# Patient Record
Sex: Male | Born: 1995
Health system: Southern US, Community
[De-identification: ages and names within clinical notes are randomized; demographics above are authoritative.]

## PROBLEM LIST (undated history)

## (undated) DIAGNOSIS — I10 Essential (primary) hypertension: Secondary | ICD-10-CM

## (undated) HISTORY — PX: WISDOM TOOTH EXTRACTION: SHX21

---
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
## 2016-01-25 ENCOUNTER — Telehealth: Payer: Self-pay | Admitting: Family Medicine

## 2016-01-25 NOTE — Telephone Encounter (Signed)
Pt.'s mom called stating she is a pt of Dr. Elisabeth CaraGilbert's and wanted to know if Dr. Sullivan LoneGilbert will take on her son. Pt is coming out of [facility] per age. CB# [phone] Thanks CC

## 2016-01-25 NOTE — Telephone Encounter (Signed)
Please review when you get back, thank you-aa

## 2016-02-05 NOTE — Telephone Encounter (Signed)
yes

## 2016-02-07 NOTE — Telephone Encounter (Signed)
Please schedule

## 2016-02-07 NOTE — Telephone Encounter (Signed)
Appointment scheduled for 03/01/2016@9 :00/MW

## 2016-03-01 ENCOUNTER — Encounter: Payer: Self-pay | Admitting: Family Medicine

## 2016-03-01 ENCOUNTER — Ambulatory Visit (INDEPENDENT_AMBULATORY_CARE_PROVIDER_SITE_OTHER): Payer: BLUE CROSS/BLUE SHIELD | Admitting: Family Medicine

## 2016-03-01 VITALS — BP 110/70 | HR 58 | Temp 97.7°F | Resp 16 | Ht 70.0 in | Wt 158.0 lb

## 2016-03-01 DIAGNOSIS — Z0000 Encounter for general adult medical examination without abnormal findings: Secondary | ICD-10-CM

## 2016-03-01 DIAGNOSIS — Z7689 Persons encountering health services in other specified circumstances: Secondary | ICD-10-CM

## 2016-03-01 DIAGNOSIS — Z7189 Other specified counseling: Secondary | ICD-10-CM

## 2016-03-01 DIAGNOSIS — Z23 Encounter for immunization: Secondary | ICD-10-CM

## 2016-03-01 LAB — POCT URINALYSIS DIPSTICK
BILIRUBIN UA: NEGATIVE
Blood, UA: NEGATIVE
Glucose, UA: NEGATIVE
KETONES UA: NEGATIVE
LEUKOCYTES UA: NEGATIVE
Nitrite, UA: NEGATIVE
PROTEIN UA: NEGATIVE
Spec Grav, UA: 1.02
Urobilinogen, UA: 0.2
pH, UA: 6

## 2016-03-01 NOTE — Progress Notes (Signed)
Patient ID: Kyle Graves, male   DOB: Jul 20, 1996, 20 y.o.   MRN: [id number]    Subjective:  HPI Pt is here to establish care today. He has no know problems and has not issues that he needs to talk about. He needs a CPE.   He reports that he feeling well, no exercising but he is on his feet all day at work and sleeps well at night.  Immunization History  Administered Date(s) Administered  . DTaP 05/25/1996, 07/21/1996, 09/21/1996, 06/21/1997, 03/27/2000  . HPV Quadrivalent 12/07/2013, 02/19/2014, 06/15/2014  . Hepatitis A 03/27/2007, 01/11/2009  . Hepatitis B 12/05/1995, 05/25/1996, 12/30/1996  . HiB (PRP-OMP) 05/25/1996, 07/21/1996, 09/21/1996, 06/21/1997  . IPV 05/25/1996, 07/21/1996, 03/22/1997, 03/27/2000  . Influenza Nasal 06/15/2014  . Influenza-Unspecified 07/20/2009  . MMR 03/22/1997, 03/27/2000  . Meningococcal Conjugate 12/07/2013  . Tdap 03/27/2007  . Varicella 03/22/1997, 12/07/2013       Prior to Admission medications   Not on File    There are no active problems to display for this patient.   History reviewed. No pertinent past medical history.  Social History   Social History  . Marital Status: Single    Spouse Name: N/A  . Number of Children: N/A  . Years of Education: N/A   Occupational History  . Manager at [employer], Full time    Social History Main Topics  . Smoking status: Current Every Day Smoker -- 1.00 packs/day for 3 years  . Smokeless tobacco: Not on file  . Alcohol Use: Yes     Comment: everyday, beer and liquor average 8 beers a day  . Drug Use: No  . Sexual Activity: Not on file   Other Topics Concern  . Not on file   Social History Narrative    No Known Allergies  Review of Systems  Constitutional: Negative.   HENT: Negative.   Eyes: Negative.   Respiratory: Negative.   Cardiovascular: Negative.   Gastrointestinal: Negative.   Genitourinary: Negative.   Musculoskeletal: Negative.   Skin: Negative.     Neurological: Negative.   Endo/Heme/Allergies: Negative.   Psychiatric/Behavioral: Negative.     Immunization History  Administered Date(s) Administered  . DTaP 05/25/1996, 07/21/1996, 09/21/1996, 06/21/1997, 03/27/2000  . HPV Quadrivalent 12/07/2013, 02/19/2014, 06/15/2014  . Hepatitis A 03/27/2007, 01/11/2009  . Hepatitis B 05-01-1996, 05/25/1996, 12/30/1996  . HiB (PRP-OMP) 05/25/1996, 07/21/1996, 09/21/1996, 06/21/1997  . IPV 05/25/1996, 07/21/1996, 03/22/1997, 03/27/2000  . Influenza Nasal 06/15/2014  . Influenza-Unspecified 07/20/2009  . MMR 03/22/1997, 03/27/2000  . Meningococcal Conjugate 12/07/2013  . Tdap 03/27/2007  . Varicella 03/22/1997, 12/07/2013   Objective:  BP 110/70 mmHg  Pulse 58  Temp(Src) 97.7 F (36.5 C) (Oral)  Resp 16  Ht '5\' 10"'  (1.778 m)  Wt 158 lb (71.668 kg)  BMI 22.67 kg/m2  Physical Exam  Constitutional: He is oriented to person, place, and time and well-developed, well-nourished, and in no distress.  HENT:  Head: Normocephalic and atraumatic.  Right Ear: External ear normal.  Left Ear: External ear normal.  Nose: Nose normal.  Mouth/Throat: Oropharynx is clear and moist.  Eyes: Conjunctivae and EOM are normal. Pupils are equal, round, and reactive to light.  Neck: Normal range of motion. Neck supple.  Cardiovascular: Normal rate, regular rhythm, normal heart sounds and intact distal pulses.   Pulmonary/Chest: Effort normal and breath sounds normal.  Abdominal: Soft. Bowel sounds are normal.  Genitourinary: Penis normal.  Musculoskeletal: Normal range of motion.  Neurological: He is alert and oriented to person,  place, and time. He has normal reflexes. Gait normal. GCS score is 15.  Skin: Skin is warm and dry.  Psychiatric: Mood, memory, affect and judgment normal.    No results found for: WBC, HGB, HCT, PLT, GLUCOSE, CHOL, TRIG, HDL, LDLDIRECT, LDLCALC, TSH, PSA, INR, GLUF, HGBA1C, MICROALBUR  CMP  No results found for: NA, K, CL,  CO2, GLUCOSE, BUN, CREATININE, CALCIUM, PROT, ALBUMIN, AST, ALT, ALKPHOS, BILITOT, GFRNONAA, GFRAA  Assessment and Plan :  1. Encounter to establish care   2. Annual physical exam  - CBC with Differential/Platelet - Lipid Panel With LDL/HDL Ratio - TSH - Comprehensive metabolic panel - POCT urinalysis dipstick  3. Need for meningococcal vaccination  - Meningococcal B, OMV (Bexsero)  4. Contraception Discussed habits at length. We discussed the need for contraception that he wishes his girlfriend not to get pregnant. We also discussed possible STDs. He is not using drugs but is drinking anywhere from 2-12 beers per night. We discussed discontinuing this altogether. We'll bring this up again at his next visit. He seemed to be agreeable to this recommendation and I'm worried about follow through on this.   Miguel Aschoff MD [facility] 03/01/2016 9:29 AM

## 2016-04-05 ENCOUNTER — Ambulatory Visit: Payer: BLUE CROSS/BLUE SHIELD | Admitting: Family Medicine

## 2016-07-23 ENCOUNTER — Ambulatory Visit: Payer: BLUE CROSS/BLUE SHIELD | Admitting: Family Medicine

## 2016-08-07 ENCOUNTER — Ambulatory Visit (INDEPENDENT_AMBULATORY_CARE_PROVIDER_SITE_OTHER): Payer: BLUE CROSS/BLUE SHIELD | Admitting: Family Medicine

## 2016-08-07 ENCOUNTER — Encounter: Payer: Self-pay | Admitting: Family Medicine

## 2016-08-07 VITALS — BP 110/70 | HR 68 | Temp 98.0°F | Resp 14 | Wt 159.2 lb

## 2016-08-07 DIAGNOSIS — J039 Acute tonsillitis, unspecified: Secondary | ICD-10-CM | POA: Diagnosis not present

## 2016-08-07 DIAGNOSIS — R05 Cough: Secondary | ICD-10-CM

## 2016-08-07 DIAGNOSIS — R059 Cough, unspecified: Secondary | ICD-10-CM

## 2016-08-07 DIAGNOSIS — R509 Fever, unspecified: Secondary | ICD-10-CM

## 2016-08-07 LAB — POCT RAPID STREP A (OFFICE): RAPID STREP A SCREEN: NEGATIVE

## 2016-08-07 MED ORDER — AMOXICILLIN 875 MG PO TABS: 875.0000 mg | ORAL_TABLET | Freq: Two times a day (BID) | ORAL | 0 refills | Status: DC

## 2016-08-07 MED ORDER — HYDROCODONE-HOMATROPINE 5-1.5 MG/5ML PO SYRP: 5.0000 mL | ORAL_SOLUTION | Freq: Three times a day (TID) | ORAL | 0 refills | Status: DC | PRN

## 2016-08-07 NOTE — Progress Notes (Signed)
Patient: Kyle Graves Male    DOB: 06/19/1996   20 y.o.   MRN: [id number] Visit Date: 08/07/2016  Today's Provider: Dortha Kernennis Chrismon, PA   Chief Complaint  Patient presents with  . URI   Subjective:    URI   This is a new problem. The current episode started in the past 7 days. The problem has been unchanged. The maximum temperature recorded prior to his arrival was 102 - 102.9 F. The fever has been present for 1 to 2 days. Associated symptoms include congestion, joint pain and a sore throat. Associated symptoms comments: fever. Treatments tried: Benzonate 200 mg and Ipratropium Bromide prescribed at Urgent Care on 08/05/2016. The treatment provided no relief.   No past medical history on file. No past surgical history on file. Family History  Problem Relation Age of Onset  . Diabetes Paternal Uncle   . Heart disease Maternal Grandfather    No Known Allergies  Previous Medications   BENZONATATE (TESSALON) 200 MG CAPSULE    Take 200 mg by mouth 3 (three) times daily as needed for cough.   IPRATROPIUM (ATROVENT) 0.03 % NASAL SPRAY    Place 2 sprays into both nostrils every 12 (twelve) hours.    Review of Systems  Constitutional: Positive for fever.  HENT: Positive for congestion and sore throat.   Respiratory: Negative.   Cardiovascular: Negative.   Musculoskeletal: Positive for joint pain.    Social History  Substance Use Topics  . Smoking status: Current Every Day Smoker    Packs/day: 1.00    Years: 3.00  . Smokeless tobacco: Not on file  . Alcohol use Yes     Comment: everyday, beer and liquor average 8 beers a day   Objective:   BP 110/70 (BP Location: Right Arm, Patient Position: Sitting, Cuff Size: Normal)   Pulse 68   Temp 98 F (36.7 C) (Oral)   Resp 14   Wt 159 lb 3.2 oz (72.2 kg)   BMI 22.84 kg/m   Physical Exam  Constitutional: He is oriented to person, place, and time. He appears well-developed and well-nourished.  HENT:  Head:  Normocephalic.  Right Ear: External ear normal.  Left Ear: External ear normal.  Fiery red tonsillar pillars with white to yellow exudates. Nasal turbinates reddened.  Eyes: Conjunctivae and EOM are normal.  Neck: Neck supple.  Slightly prominent and mildly sore submandibular nodes.  Cardiovascular: Regular rhythm.   Pulmonary/Chest: Effort normal and breath sounds normal.  Abdominal: Bowel sounds are normal.  Lymphadenopathy:    He has cervical adenopathy.  Neurological: He is alert and oriented to person, place, and time.      Assessment & Plan:     1. Exudative tonsillitis Onset 2 days ago with fever and cough. Rapid strep test negative and flu test at the Urgent Care was negative on 08-05-16. Tonsils and throat red with white to yellow exudates. Will get Monospot, CBC and send throat culture to the lab. Given Amoxicillin and cough syrup. Gargle with warm saltwater and continue Tylenol or Advil prn fever. Home to rest. Increase fluid intake and recheck pending reports. - POCT rapid strep A - CBC with Differential/Platelet - Culture, Group A Strep - Monospot - amoxicillin (AMOXIL) 875 MG tablet; Take 1 tablet (875 mg total) by mouth 2 (two) times daily.  Dispense: 20 tablet; Refill: 0  2. Fever, unspecified fever cause Onset 2 days ago suddenly. Some cough but the worst symptom is the sore throat. Rapid strep  test negative. Will check culture at the lab and get CBC with Monospot test. May use Tylenol or Advil prn discomfort or fever. Home to rest. No work until free of fever for 24 hours. - POCT rapid strep A - CBC with Differential/Platelet - Monospot - amoxicillin (AMOXIL) 875 MG tablet; Take 1 tablet (875 mg total) by mouth 2 (two) times daily.  Dispense: 20 tablet; Refill: 0  3. Cough Hacking cough with occasional yellow sputum. Will give Hycodan cough syrup. No relief from OTC cough meds or Tessalon. Increase fluid intake and check CBC.  - CBC with Differential/Platelet -  amoxicillin (AMOXIL) 875 MG tablet; Take 1 tablet (875 mg total) by mouth 2 (two) times daily.  Dispense: 20 tablet; Refill: 0 - HYDROcodone-homatropine (HYCODAN) 5-1.5 MG/5ML syrup; Take 5 mLs by mouth every 8 (eight) hours as needed for cough.  Dispense: 120 mL; Refill: 0

## 2016-08-07 NOTE — Patient Instructions (Signed)
Tonsillitis Tonsillitis is an infection of the throat that causes the tonsils to become red, tender, and swollen. Tonsils are collections of lymphoid tissue at the back of the throat. Each tonsil has crevices (crypts). Tonsils help fight nose and throat infections and keep infection from spreading to other parts of the body for the first 18 months of life. What are the causes? Sudden (acute) tonsillitis is usually caused by infection with streptococcal bacteria. Long-lasting (chronic) tonsillitis occurs when the crypts of the tonsils become filled with pieces of food and bacteria, which makes it easy for the tonsils to become repeatedly infected. What are the signs or symptoms? Symptoms of tonsillitis include:  A sore throat, with possible difficulty swallowing.  White patches on the tonsils.  Fever.  Tiredness.  New episodes of snoring during sleep, when you did not snore before.  Small, foul-smelling, yellowish-white pieces of material (tonsilloliths) that you occasionally cough up or spit out. The tonsilloliths can also cause you to have bad breath.  How is this diagnosed? Tonsillitis can be diagnosed through a physical exam. Diagnosis can be confirmed with the results of lab tests, including a throat culture. How is this treated? The goals of tonsillitis treatment include the reduction of the severity and duration of symptoms and prevention of associated conditions. Symptoms of tonsillitis can be improved with the use of steroids to reduce the swelling. Tonsillitis caused by bacteria can be treated with antibiotic medicines. Usually, treatment with antibiotic medicines is started before the cause of the tonsillitis is known. However, if it is determined that the cause is not bacterial, antibiotic medicines will not treat the tonsillitis. If attacks of tonsillitis are severe and frequent, your health care provider may recommend surgery to remove the tonsils (tonsillectomy). Follow these  instructions at home:  Rest as much as possible and get plenty of sleep.  Drink plenty of fluids. While the throat is very sore, eat soft foods or liquids, such as sherbet, soups, or instant breakfast drinks.  Eat frozen ice pops.  Gargle with a warm or cold liquid to help soothe the throat. Mix 1/4 teaspoon of salt and 1/4 teaspoon of baking soda in 8 oz of water. Contact a health care provider if:  Large, tender lumps develop in your neck.  A rash develops.  A green, yellow-brown, or bloody substance is coughed up.  You are unable to swallow liquids or food for 24 hours.  You notice that only one of the tonsils is swollen. Get help right away if:  You develop any new symptoms such as vomiting, severe headache, stiff neck, chest pain, or trouble breathing or swallowing.  You have severe throat pain along with drooling or voice changes.  You have severe pain, unrelieved with recommended medications.  You are unable to fully open the mouth.  You develop redness, swelling, or severe pain anywhere in the neck.  You have a fever. This information is not intended to replace advice given to you by your health care provider. Make sure you discuss any questions you have with your health care provider. Document Released: 06/06/2005 Document Revised: 02/02/2016 Document Reviewed: 02/13/2013 Elsevier Interactive Patient Education  2017 Elsevier Inc.  

## 2016-08-08 LAB — CBC WITH DIFFERENTIAL/PLATELET
BASOS ABS: 0 10*3/uL (ref 0.0–0.2)
Basos: 0 %
EOS (ABSOLUTE): 0.1 10*3/uL (ref 0.0–0.4)
Eos: 2 %
Hematocrit: 43.9 % (ref 37.5–51.0)
Hemoglobin: 15.2 g/dL (ref 12.6–17.7)
Immature Grans (Abs): 0 10*3/uL (ref 0.0–0.1)
Immature Granulocytes: 0 %
LYMPHS ABS: 1.3 10*3/uL (ref 0.7–3.1)
Lymphs: 15 %
MCH: 30.8 pg (ref 26.6–33.0)
MCHC: 34.6 g/dL (ref 31.5–35.7)
MCV: 89 fL (ref 79–97)
MONOS ABS: 1.1 10*3/uL — AB (ref 0.1–0.9)
Monocytes: 13 %
Neutrophils Absolute: 6.3 10*3/uL (ref 1.4–7.0)
Neutrophils: 70 %
Platelets: 205 10*3/uL (ref 150–379)
RBC: 4.93 x10E6/uL (ref 4.14–5.80)
RDW: 13.4 % (ref 12.3–15.4)
WBC: 8.9 10*3/uL (ref 3.4–10.8)

## 2016-08-08 LAB — MONONUCLEOSIS SCREEN: MONO SCREEN: NEGATIVE

## 2016-08-09 ENCOUNTER — Telehealth: Payer: Self-pay

## 2016-08-09 NOTE — Telephone Encounter (Signed)
-----   Message from Tamsen Roersennis E Chrismon, [place] sent at 08/09/2016 12:36 AM EST ----- Negative mono test and normal blood cell tests. Take all the antibiotic given and gargle with warm saltwater 4-5 times a day. Recheck if no better in 10 days.

## 2016-08-09 NOTE — Telephone Encounter (Signed)
Patient's mother Lilia ProDebra Long ([place]) advised as directed below.

## 2016-08-11 LAB — CULTURE, GROUP A STREP

## 2016-08-13 ENCOUNTER — Telehealth: Payer: Self-pay

## 2016-08-13 NOTE — Telephone Encounter (Signed)
Pt advised.   Thanks,   -Shanequia Kendrick  

## 2016-08-13 NOTE — Telephone Encounter (Signed)
-----   Message from Tamsen Roersennis E Chrismon, [place] sent at 08/13/2016  9:08 AM EST ----- Some bacteria on lab throat culture. Finish all the antibiotics and recheck if any symptoms remain. Mono test was negative.

## 2016-08-13 NOTE — Telephone Encounter (Signed)
LMTCB

## 2016-08-13 NOTE — Telephone Encounter (Signed)
Pt's mom called back.  teri

## 2017-01-07 ENCOUNTER — Ambulatory Visit (INDEPENDENT_AMBULATORY_CARE_PROVIDER_SITE_OTHER): Payer: BLUE CROSS/BLUE SHIELD | Admitting: Family Medicine

## 2017-01-07 ENCOUNTER — Encounter: Payer: Self-pay | Admitting: Family Medicine

## 2017-01-07 VITALS — BP 110/82 | HR 69 | Temp 97.8°F | Wt 151.8 lb

## 2017-01-07 DIAGNOSIS — Z113 Encounter for screening for infections with a predominantly sexual mode of transmission: Secondary | ICD-10-CM

## 2017-01-07 NOTE — Progress Notes (Signed)
   Patient: Kyle Graves Male    DOB: [birth year]   21 y.o.   MRN: [id number] Visit Date: 01/07/2017  Today's Provider: Dortha Kern, PA   Chief Complaint  Patient presents with  . Follow-up    STD check   Subjective:    HPI Patient is here today requesting a STD check. The patient's pertinent negatives include no genital injury, genital itching, genital lesions, pelvic pain, penile discharge, penile pain, priapism, scrotal swelling or testicular pain. Patient has unknown exposure to STD's.   There are no active problems to display for this patient.  No past medical history on file.   No past surgical history on file.   Family History  Problem Relation Age of Onset  . Diabetes Paternal Uncle   . Heart disease Maternal Grandfather    No Known Allergies    Previous Medications   No medications on file    Review of Systems  Social History  Substance Use Topics  . Smoking status: Current Every Day Smoker    Packs/day: 1.00    Years: 3.00  . Smokeless tobacco: Never Used  . Alcohol use Yes     Comment: everyday, beer and liquor average 8 beers a day   Objective:   BP 110/82 (BP Location: Right Arm, Patient Position: Sitting, Cuff Size: Normal)   Pulse 69   Temp 97.8 F (36.6 C) (Oral)   Wt 151 lb 12.8 oz (68.9 kg)   SpO2 98%   BMI 21.78 kg/m   Physical Exam  Cardiovascular: Normal rate and regular rhythm.   Pulmonary/Chest: Effort normal and breath sounds normal.  Abdominal: Soft. Bowel sounds are normal.  Genitourinary: Penis normal. No penile tenderness.      Assessment & Plan:     1. Screening examination for STD (sexually transmitted disease) Has had a couple of male sexual partners in the past 6 months. Last intercourse was a week ago. Partners have not had any diagnoses or symptoms. He denies fever, rash, penile discharge, genital lesions or local lymphadenopathy. "Just want to be sure." Given printout regarding STD precautions. Recheck  pending lab reports. - HIV antibody - GC/Chlamydia Probe Amp

## 2017-01-07 NOTE — Patient Instructions (Signed)
Sexually Transmitted Disease A sexually transmitted disease (STD) is a disease or infection that may be passed (transmitted) from person to person, usually during sexual activity. This may happen by way of saliva, semen, blood, vaginal mucus, or urine. Common STDs include:  Gonorrhea.  Chlamydia.  Syphilis.  HIV and AIDS.  Genital herpes.  Hepatitis B and C.  Trichomonas.  Human papillomavirus (HPV).  Pubic lice.  Scabies.  Mites.  Bacterial vaginosis. What are the causes? An STD may be caused by bacteria, a virus, or parasites. STDs are often transmitted during sexual activity if one person is infected. However, they may also be transmitted through nonsexual means. STDs may be transmitted after:  Sexual intercourse with an infected person.  Sharing sex toys with an infected person.  Sharing needles with an infected person or using unclean piercing or tattoo needles.  Having intimate contact with the genitals, mouth, or rectal areas of an infected person.  Exposure to infected fluids during birth. What are the signs or symptoms? Different STDs have different symptoms. Some people may not have any symptoms. If symptoms are present, they may include:  Painful or bloody urination.  Pain in the pelvis, abdomen, vagina, anus, throat, or eyes.  A skin rash, itching, or irritation.  Growths, ulcerations, blisters, or sores in the genital and anal areas.  Abnormal vaginal discharge with or without bad odor.  Penile discharge in men.  Fever.  Pain or bleeding during sexual intercourse.  Swollen glands in the groin area.  Yellow skin and eyes (jaundice). This is seen with hepatitis.  Swollen testicles.  Infertility.  Sores and blisters in the mouth. How is this diagnosed? To make a diagnosis, your health care provider may:  Take a medical history.  Perform a physical exam.  Take a sample of any discharge to examine.  Swab the throat, cervix, opening to  the penis, rectum, or vagina for testing.  Test a sample of your first morning urine.  Perform blood tests.  Perform a Pap test, if this applies.  Perform a colposcopy.  Perform a laparoscopy. How is this treated? Treatment depends on the STD. Some STDs may be treated but not cured.  Chlamydia, gonorrhea, trichomonas, and syphilis can be cured with antibiotic medicine.  Genital herpes, hepatitis, and HIV can be treated, but not cured, with prescribed medicines. The medicines lessen symptoms.  Genital warts from HPV can be treated with medicine or by freezing, burning (electrocautery), or surgery. Warts may come back.  HPV cannot be cured with medicine or surgery. However, abnormal areas may be removed from the cervix, vagina, or vulva.  If your diagnosis is confirmed, your recent sexual partners need treatment. This is true even if they are symptom-free or have a negative culture or evaluation. They should not have sex until their health care providers say it is okay.  Your health care provider may test you for infection again 3 months after treatment. How is this prevented? Take these steps to reduce your risk of getting an STD:  Use latex condoms, dental dams, and water-soluble lubricants during sexual activity. Do not use petroleum jelly or oils.  Avoid having multiple sex partners.  Do not have sex with someone who has other sex partners.  Do not have sex with anyone you do not know or who is at high risk for an STD.  Avoid risky sex practices that can break your skin.  Do not have sex if you have open sores on your mouth or skin.    Avoid drinking too much alcohol or taking illegal drugs. Alcohol and drugs can affect your judgment and put you in a vulnerable position.  Avoid engaging in oral and anal sex acts.  Get vaccinated for HPV and hepatitis. If you have not received these vaccines in the past, talk to your health care provider about whether one or both might be  right for you.  If you are at risk of being infected with HIV, it is recommended that you take a prescription medicine daily to prevent HIV infection. This is called pre-exposure prophylaxis (PrEP). You are considered at risk if:  You are a man who has sex with other men (MSM).  You are a heterosexual man or woman and are sexually active with more than one partner.  You take drugs by injection.  You are sexually active with a partner who has HIV.  Talk with your health care provider about whether you are at high risk of being infected with HIV. If you choose to begin PrEP, you should first be tested for HIV. You should then be tested every 3 months for as long as you are taking PrEP. Contact a health care provider if:  See your health care provider.  Tell your sexual partner(s). They should be tested and treated for any STDs.  Do not have sex until your health care provider says it is okay. Get help right away if: Contact your health care provider right away if:  You have severe abdominal pain.  You are a man and notice swelling or pain in your testicles.  You are a woman and notice swelling or pain in your vagina. This information is not intended to replace advice given to you by your health care provider. Make sure you discuss any questions you have with your health care provider. Document Released: 11/17/2002 Document Revised: 03/16/2016 Document Reviewed: 03/17/2013 Elsevier Interactive Patient Education  2017 [employer]  

## 2017-01-08 LAB — HIV ANTIBODY (ROUTINE TESTING W REFLEX): HIV SCREEN 4TH GENERATION: NONREACTIVE

## 2017-01-08 LAB — GC/CHLAMYDIA PROBE AMP
CHLAMYDIA, DNA PROBE: NEGATIVE
NEISSERIA GONORRHOEAE BY PCR: NEGATIVE

## 2017-01-09 ENCOUNTER — Telehealth: Payer: Self-pay

## 2017-01-09 NOTE — Telephone Encounter (Signed)
Pt returned Nikki's call  His call back is [phone]  [facility]

## 2017-01-09 NOTE — Telephone Encounter (Signed)
-----   Message from Tamsen Roers, [place] sent at 01/08/2017  7:37 AM EDT ----- HIV blood test negative. Awaiting final lab reports of urine tests.

## 2017-01-09 NOTE — Telephone Encounter (Signed)
-----   Message from Tamsen Roers, [place] sent at 01/08/2017  4:56 PM EDT ----- All tests negative for any STD. Recheck prn.

## 2017-01-09 NOTE — Telephone Encounter (Signed)
LMTCB

## 2017-01-09 NOTE — Telephone Encounter (Signed)
Patient advised.

## 2017-04-26 ENCOUNTER — Encounter: Payer: Self-pay | Admitting: Family Medicine

## 2017-04-26 ENCOUNTER — Ambulatory Visit (INDEPENDENT_AMBULATORY_CARE_PROVIDER_SITE_OTHER): Payer: BLUE CROSS/BLUE SHIELD | Admitting: Family Medicine

## 2017-04-26 VITALS — BP 122/76 | HR 86 | Temp 98.0°F | Resp 16 | Wt 151.0 lb

## 2017-04-26 DIAGNOSIS — J069 Acute upper respiratory infection, unspecified: Secondary | ICD-10-CM | POA: Diagnosis not present

## 2017-04-26 NOTE — Patient Instructions (Signed)
Try tylenol/ibuprofen for aches or fevers, mucinex for cough and chest congestion, and nasal saline spray for congestion   Upper Respiratory Infection, Adult Most upper respiratory infections (URIs) are a viral infection of the air passages leading to the lungs. A URI affects the nose, throat, and upper air passages. The most common type of URI is nasopharyngitis and is typically referred to as "the common cold." URIs run their course and usually go away on their own. Most of the time, a URI does not require medical attention, but sometimes a bacterial infection in the upper airways can follow a viral infection. This is called a secondary infection. Sinus and middle ear infections are common types of secondary upper respiratory infections. Bacterial pneumonia can also complicate a URI. A URI can worsen asthma and chronic obstructive pulmonary disease (COPD). Sometimes, these complications can require emergency medical care and may be life threatening. What are the causes? Almost all URIs are caused by viruses. A virus is a type of germ and can spread from one person to another. What increases the risk? You may be at risk for a URI if:  You smoke.  You have chronic heart or lung disease.  You have a weakened defense (immune) system.  You are very young or very old.  You have nasal allergies or asthma.  You work in crowded or poorly ventilated areas.  You work in health care facilities or schools.  What are the signs or symptoms? Symptoms typically develop 2-3 days after you come in contact with a cold virus. Most viral URIs last 7-10 days. However, viral URIs from the influenza virus (flu virus) can last 14-18 days and are typically more severe. Symptoms may include:  Runny or stuffy (congested) nose.  Sneezing.  Cough.  Sore throat.  Headache.  Fatigue.  Fever.  Loss of appetite.  Pain in your forehead, behind your eyes, and over your cheekbones (sinus pain).  Muscle  aches.  How is this diagnosed? Your health care provider may diagnose a URI by:  Physical exam.  Tests to check that your symptoms are not due to another condition such as: ? Strep throat. ? Sinusitis. ? Pneumonia. ? Asthma.  How is this treated? A URI goes away on its own with time. It cannot be cured with medicines, but medicines may be prescribed or recommended to relieve symptoms. Medicines may help:  Reduce your fever.  Reduce your cough.  Relieve nasal congestion.  Follow these instructions at home:  Take medicines only as directed by your health care provider.  Gargle warm saltwater or take cough drops to comfort your throat as directed by your health care provider.  Use a warm mist humidifier or inhale steam from a shower to increase air moisture. This may make it easier to breathe.  Drink enough fluid to keep your urine clear or pale yellow.  Eat soups and other clear broths and maintain good nutrition.  Rest as needed.  Return to work when your temperature has returned to normal or as your health care provider advises. You may need to stay home longer to avoid infecting others. You can also use a face mask and careful hand washing to prevent spread of the virus.  Increase the usage of your inhaler if you have asthma.  Do not use any tobacco products, including cigarettes, chewing tobacco, or electronic cigarettes. If you need help quitting, ask your health care provider. How is this prevented? The best way to protect yourself from getting a  cold is to practice good hygiene.  Avoid oral or hand contact with people with cold symptoms.  Wash your hands often if contact occurs.  There is no clear evidence that vitamin C, vitamin E, echinacea, or exercise reduces the chance of developing a cold. However, it is always recommended to get plenty of rest, exercise, and practice good nutrition. Contact a health care provider if:  You are getting worse rather than  better.  Your symptoms are not controlled by medicine.  You have chills.  You have worsening shortness of breath.  You have brown or red mucus.  You have yellow or brown nasal discharge.  You have pain in your face, especially when you bend forward.  You have a fever.  You have swollen neck glands.  You have pain while swallowing.  You have white areas in the back of your throat. Get help right away if:  You have severe or persistent: ? Headache. ? Ear pain. ? Sinus pain. ? Chest pain.  You have chronic lung disease and any of the following: ? Wheezing. ? Prolonged cough. ? Coughing up blood. ? A change in your usual mucus.  You have a stiff neck.  You have changes in your: ? Vision. ? Hearing. ? Thinking. ? Mood. This information is not intended to replace advice given to you by your health care provider. Make sure you discuss any questions you have with your health care provider. Document Released: 02/20/2001 Document Revised: 04/29/2016 Document Reviewed: 12/02/2013 Elsevier Interactive Patient Education  2017 [employer].

## 2017-04-26 NOTE — Progress Notes (Signed)
Patient: Kyle Graves Male    DOB: 06-Apr-1996   21 y.o.   MRN: [id number] Visit Date: 04/26/2017  Today's Provider: Shirlee Latch, MD   Chief Complaint  Patient presents with  . URI   Subjective:    URI   This is a new problem. The current episode started yesterday. The problem has been gradually worsening. Maximum temperature: undocumented temperature, but pt had chills and sweats yesterday. Associated symptoms include abdominal pain (only because he needs to use the bathroom currently), chest pain, congestion, coughing (productive of yellow sputum), nausea, rhinorrhea and sneezing. Pertinent negatives include no diarrhea, dysuria, ear pain, headaches, joint pain, neck pain, plugged ear sensation, rash, sinus pain, sore throat (but states it is scratchy, +post-nasal drip), swollen glands, vomiting or wheezing. He has tried antihistamine (OTC Sinus/Cold Medication) for the symptoms. The treatment provided no relief.   No known sick contacts    No Known Allergies  No current outpatient prescriptions on file.  Review of Systems  HENT: Positive for congestion, rhinorrhea and sneezing. Negative for ear pain, sinus pain and sore throat (but states it is scratchy, +post-nasal drip).   Respiratory: Positive for cough (productive of yellow sputum). Negative for wheezing.   Cardiovascular: Positive for chest pain.  Gastrointestinal: Positive for abdominal pain (only because he needs to use the bathroom currently) and nausea. Negative for diarrhea and vomiting.  Genitourinary: Negative for dysuria.  Musculoskeletal: Negative for joint pain and neck pain.  Skin: Negative for rash.  Neurological: Negative for headaches.    Social History  Substance Use Topics  . Smoking status: Current Every Day Smoker    Packs/day: 0.50    Years: 3.00  . Smokeless tobacco: Never Used  . Alcohol use Yes     Comment: everyday, beer and liquor average 8 beers a day   Objective:   BP  122/76 (BP Location: Left Arm, Patient Position: Sitting, Cuff Size: Normal)   Pulse 86   Temp 98 F (36.7 C) (Oral)   Resp 16   Wt 151 lb (68.5 kg)   SpO2 96%   BMI 21.67 kg/m  Vitals:   04/26/17 1050  BP: 122/76  Pulse: 86  Resp: 16  Temp: 98 F (36.7 C)  TempSrc: Oral  SpO2: 96%  Weight: 151 lb (68.5 kg)     Physical Exam  Constitutional: He is oriented to person, place, and time. He appears well-developed and well-nourished. No distress.  HENT:  Head: Normocephalic and atraumatic.  Right Ear: Tympanic membrane and external ear normal.  Left Ear: Tympanic membrane and external ear normal.  Mouth/Throat: Oropharynx is clear and moist. No oropharyngeal exudate.  Eyes: Pupils are equal, round, and reactive to light. Conjunctivae are normal. No scleral icterus.  Neck: Neck supple.  Cardiovascular: Normal rate, regular rhythm, normal heart sounds and intact distal pulses.   No murmur heard. Pulmonary/Chest: Effort normal and breath sounds normal. No respiratory distress. He has no wheezes. He has no rales.  Abdominal: Soft. Bowel sounds are normal. He exhibits no distension. There is no tenderness. There is no rebound and no guarding.  Musculoskeletal: He exhibits no edema.  Lymphadenopathy:    He has no cervical adenopathy.  Neurological: He is alert and oriented to person, place, and time.  Skin: Skin is warm and dry. No rash noted.  Vitals reviewed.       Assessment & Plan:         1. Viral URI - no evidence of  bacterial infection, exam benign - discussed expected course, symptomatic management, and return precautions  The entirety of the information documented in the History of Present Illness, Review of Systems and Physical Exam were personally obtained by me. Portions of this information were initially documented by Irving Burton Ratchford, CMA and reviewed by me for thoroughness and accuracy.    Shirlee Latch, MD  [facility]

## 2017-06-04 ENCOUNTER — Ambulatory Visit (INDEPENDENT_AMBULATORY_CARE_PROVIDER_SITE_OTHER): Payer: BLUE CROSS/BLUE SHIELD | Admitting: Family Medicine

## 2017-06-04 ENCOUNTER — Encounter: Payer: Self-pay | Admitting: Family Medicine

## 2017-06-04 VITALS — BP 116/82 | HR 72 | Temp 98.1°F | Resp 16 | Wt 152.0 lb

## 2017-06-04 DIAGNOSIS — J029 Acute pharyngitis, unspecified: Secondary | ICD-10-CM | POA: Diagnosis not present

## 2017-06-04 LAB — POCT RAPID STREP A (OFFICE): Rapid Strep A Screen: NEGATIVE

## 2017-06-04 NOTE — Progress Notes (Signed)
Patient: Kyle Graves Male    DOB: March 09, 1996   21 y.o.   MRN: [id number] Visit Date: 06/04/2017  Today's Provider: Shirlee Latch, MD   Chief Complaint  Patient presents with  . Sore Throat   Subjective:    Sore Throat   This is a recurrent problem. Episode onset: x 2 days. The problem has been unchanged. The pain is worse on the left side. The maximum temperature recorded prior to his arrival was 101 - 101.9 F (yesterday). The pain is at a severity of 4/10. The pain is moderate. Associated symptoms include coughing (dry), headaches, a plugged ear sensation, swollen glands and trouble swallowing. Pertinent negatives include no abdominal pain, congestion, diarrhea, ear discharge, ear pain, hoarse voice, shortness of breath or vomiting. He has tried NSAIDs (antihistamines) for the symptoms. The treatment provided moderate relief.       No Known Allergies  No current outpatient prescriptions on file.  Review of Systems  HENT: Positive for trouble swallowing. Negative for congestion, ear discharge, ear pain and hoarse voice.   Respiratory: Positive for cough (dry). Negative for shortness of breath.   Gastrointestinal: Negative for abdominal pain, diarrhea and vomiting.  Neurological: Positive for headaches.    Social History  Substance Use Topics  . Smoking status: Current Every Day Smoker    Packs/day: 0.50    Years: 3.00  . Smokeless tobacco: Never Used  . Alcohol use Yes     Comment: everyday, beer and liquor average 8 beers a day   Objective:   BP 116/82 (BP Location: Left Arm, Patient Position: Sitting, Cuff Size: Normal)   Pulse 72   Temp 98.1 F (36.7 C) (Oral)   Resp 16   Wt 152 lb (68.9 kg)   SpO2 98%   BMI 21.81 kg/m  Vitals:   06/04/17 0806  BP: 116/82  Pulse: 72  Resp: 16  Temp: 98.1 F (36.7 C)  TempSrc: Oral  SpO2: 98%  Weight: 152 lb (68.9 kg)     Physical Exam  Constitutional: He is oriented to person, place, and time.  He appears well-developed and well-nourished. No distress.  HENT:  Head: Normocephalic and atraumatic.  Right Ear: Tympanic membrane, external ear and ear canal normal.  Left Ear: Tympanic membrane, external ear and ear canal normal.  Nose: Nose normal.  Mouth/Throat: Uvula is midline and mucous membranes are normal. Posterior oropharyngeal erythema present. No oropharyngeal exudate.  Eyes: Pupils are equal, round, and reactive to light. Conjunctivae are normal. No scleral icterus.  Neck: Neck supple. No thyromegaly present.  Cardiovascular: Normal rate, regular rhythm, normal heart sounds and intact distal pulses.   No murmur heard. Pulmonary/Chest: Effort normal and breath sounds normal. No respiratory distress. He has no wheezes. He has no rales.  Abdominal: Soft. Bowel sounds are normal. He exhibits no distension. There is no tenderness. There is no rebound and no guarding.  Musculoskeletal: He exhibits no edema.  Lymphadenopathy:    He has no cervical adenopathy.  Neurological: He is alert and oriented to person, place, and time.  Skin: Skin is warm and dry. No rash noted.  Psychiatric: He has a normal mood and affect. His behavior is normal.  Vitals reviewed.       Assessment & Plan:     1. Sore throat - likely viral pharyngitis, especially with associated cough - VSS and afebrile today - POCT rapid strep A - negative - continue symptomatic management - return precautions discussed  The entirety of the information documented in the History of Present Illness, Review of Systems and Physical Exam were personally obtained by me. Portions of this information were initially documented by Raquel Sarna Ratchford, CMA and reviewed by me for thoroughness and accuracy.     Lavon Paganini, MD  [facility]

## 2017-06-04 NOTE — Patient Instructions (Signed)

## 2017-07-18 ENCOUNTER — Ambulatory Visit: Payer: Self-pay | Admitting: Physician Assistant

## 2017-07-18 ENCOUNTER — Telehealth: Payer: Self-pay

## 2017-07-18 ENCOUNTER — Encounter: Payer: Self-pay | Admitting: Physician Assistant

## 2017-07-18 VITALS — BP 122/64 | HR 64 | Temp 98.4°F | Resp 16 | Wt 152.0 lb

## 2017-07-18 DIAGNOSIS — R079 Chest pain, unspecified: Secondary | ICD-10-CM

## 2017-07-18 DIAGNOSIS — R200 Anesthesia of skin: Secondary | ICD-10-CM

## 2017-07-18 DIAGNOSIS — F1411 Cocaine abuse, in remission: Secondary | ICD-10-CM

## 2017-07-18 DIAGNOSIS — F1010 Alcohol abuse, uncomplicated: Secondary | ICD-10-CM

## 2017-07-18 DIAGNOSIS — R0789 Other chest pain: Secondary | ICD-10-CM

## 2017-07-18 DIAGNOSIS — R1110 Vomiting, unspecified: Secondary | ICD-10-CM | POA: Insufficient documentation

## 2017-07-18 DIAGNOSIS — Z87898 Personal history of other specified conditions: Secondary | ICD-10-CM

## 2017-07-18 LAB — COMPLETE METABOLIC PANEL WITH GFR
AG Ratio: 1.9 (calc) (ref 1.0–2.5)
ALT: 25 U/L (ref 9–46)
AST: 20 U/L (ref 10–40)
Albumin: 4.7 g/dL (ref 3.6–5.1)
Alkaline phosphatase (APISO): 35 U/L — ABNORMAL LOW (ref 40–115)
BUN: 13 mg/dL (ref 7–25)
CO2: 27 mmol/L (ref 20–32)
Calcium: 9.5 mg/dL (ref 8.6–10.3)
Chloride: 103 mmol/L (ref 98–110)
Creat: 0.87 mg/dL (ref 0.60–1.35)
GFR, Est African American: 143 mL/min/{1.73_m2} (ref >=60)
GFR, Est Non African American: 123 mL/min/{1.73_m2} (ref >=60)
Globulin: 2.5 g/dL (calc) (ref 1.9–3.7)
Glucose, Bld: 99 mg/dL (ref 65–99)
Potassium: 4.3 mmol/L (ref 3.5–5.3)
Sodium: 138 mmol/L (ref 135–146)
Total Bilirubin: 0.9 mg/dL (ref 0.2–1.2)
Total Protein: 7.2 g/dL (ref 6.1–8.1)

## 2017-07-18 LAB — CBC WITH DIFFERENTIAL/PLATELET
Basophils Absolute: 33 cells/uL (ref 0–200)
Basophils Relative: 0.5 %
Eosinophils Absolute: 172 cells/uL (ref 15–500)
Eosinophils Relative: 2.6 %
HCT: 44.5 % (ref 38.5–50.0)
Hemoglobin: 15.4 g/dL (ref 13.2–17.1)
Lymphs Abs: 2066 cells/uL (ref 850–3900)
MCH: 30.1 pg (ref 27.0–33.0)
MCHC: 34.6 g/dL (ref 32.0–36.0)
MCV: 87.1 fL (ref 80.0–100.0)
MPV: 11.1 fL (ref 7.5–12.5)
Monocytes Relative: 10.6 %
Neutro Abs: 3630 cells/uL (ref 1500–7800)
Neutrophils Relative %: 55 %
Platelets: 237 10*3/uL (ref 140–400)
RBC: 5.11 10*6/uL (ref 4.20–5.80)
RDW: 12.6 % (ref 11.0–15.0)
Total Lymphocyte: 31.3 %
WBC mixed population: 700 cells/uL (ref 200–950)
WBC: 6.6 10*3/uL (ref 3.8–10.8)

## 2017-07-18 NOTE — Telephone Encounter (Signed)
Patient's mom is calling saying that the patient has had chest tightness with shortness of breath since last night. She reports that initially his symptoms would come and go lasting less than 1 minute each episode. However, last night his pain got really severe and he called EMS. She reports that they did not take him to the ER because his EKG was normal and after taking aspirin his symptoms subsided. She reports that the patient is currently at work and his symptoms have returned. She reports that he has not taken anything for the pain. Appt was scheduled for this afternoon for evaluation. I advised the mother that if he develops any blurred vision, slurred speech, headaches, or if his current symptoms worsen that he must go to the ER. She verbalized understanding.

## 2017-07-18 NOTE — Progress Notes (Signed)
Patient: Kyle Graves Male    DOB: 10-Nov-1995   21 y.o.   MRN: [id number] Visit Date: 07/19/2017  Today's Provider: Trey SailorsAdriana M Caelyn Route, PA-C   Chief Complaint  Patient presents with  . Chest Pain    Started last night.   Subjective:    Kyle Graves is a 21 y/o with history of alcohol and substance abuse presenting today with chest pain. He reports last night, he was awakened by a sensation of chest pain in his left chest and also left arm numbness. He called 911 and EMS came. They took an EKG, which was normal, as well as his blood pressure, which he doesn't recall. He did not go to the emergency room. He was able to go back to sleep as the symptoms went away.  The next morning he woke up and the symptoms returned. He vomited once and had chest pain with left arm numbness. There is no pain going down his arm or to his back or jaw. He is not diaphoretic. The pain is constant, not worse with movement. He has no history of cardiac issues. Denies problems breathing, asthma. No injuries, recent weight lifting. Denies heartburn.  He has a history of substance abuse. He says he used to drink a pint of liquor and a pint of beer a night. Now he says he doesn't drink "much." He describes drinking 3-4 beers per night. Last night he had one margarita. He has gotten a DUI. He is having to attend class weekly for this. He does not attend AA. He says "this is just something I have to stop on my own."  Also has a history of drug abuse. Last used cocaine in July. Denies using cocaine or other drugs recently. He also smokes one pack per day.  Chest Pain   This is a new problem. The current episode started yesterday. The onset quality is sudden. The problem has been gradually improving. The pain is present in the lateral region. The quality of the pain is described as sharp. The pain does not radiate. Associated symptoms include dizziness, irregular heartbeat, malaise/fatigue, numbness  (Numbness down his left arm.), palpitations, shortness of breath (Short of breath last night.) and vomiting (Vomited this morning.). Pertinent negatives include no abdominal pain, cough, diaphoresis, fever, headaches, hemoptysis, lower extremity edema, nausea, near-syncope or syncope.       No Known Allergies  No current outpatient medications on file.  Review of Systems  Constitutional: Positive for chills, fatigue and malaise/fatigue. Negative for activity change, appetite change, diaphoresis, fever and unexpected weight change.  Respiratory: Positive for chest tightness and shortness of breath (Short of breath last night.). Negative for apnea, cough, hemoptysis, choking, wheezing and stridor.   Cardiovascular: Positive for chest pain and palpitations. Negative for leg swelling, syncope and near-syncope.  Gastrointestinal: Positive for blood in stool (Pt says it "happens all the time" "Probably from Wiping") and vomiting (Vomited this morning.). Negative for abdominal distention, abdominal pain, anal bleeding, constipation, diarrhea, nausea and rectal pain.  Neurological: Positive for dizziness and numbness (Numbness down his left arm.). Negative for light-headedness and headaches.    Social History   Tobacco Use  . Smoking status: Current Every Day Smoker    Packs/day: 0.50    Years: 3.00    Pack years: 1.50  . Smokeless tobacco: Never Used  Substance Use Topics  . Alcohol use: Yes    Comment: everyday, beer and liquor average 8 beers a day  Objective:   BP 122/64 (BP Location: Right Arm, Patient Position: Sitting, Cuff Size: Large)   Pulse 64   Temp 98.4 F (36.9 C) (Oral)   Resp 16   Wt 152 lb (68.9 kg)   BMI 21.81 kg/m  Vitals:   07/18/17 1531  BP: 122/64  Pulse: 64  Resp: 16  Temp: 98.4 F (36.9 C)  TempSrc: Oral  Weight: 152 lb (68.9 kg)     Physical Exam  Constitutional: He is oriented to person, place, and time. He appears well-developed and  well-nourished.  Cardiovascular: Normal rate. Exam reveals gallop.  No murmur heard. Pulmonary/Chest: Effort normal and breath sounds normal. He exhibits no tenderness.  Abdominal: Soft. Bowel sounds are normal. There is no tenderness.  Neurological: He is alert and oriented to person, place, and time.  Skin: Skin is warm and dry.  Psychiatric: His behavior is normal. His mood appears anxious. He exhibits a depressed mood.        Assessment & Plan:     1. Other chest pain  EKG with no ST elevations today. There are some T-wave inversions in V1, though I have no prior comparison. No sign of LVH. Rhythm irregularly irregular but with P-waves for every complex. Do hear a gallop, which may be physiologic. Low risk for cardiac etiology of his symptoms. Have offered cardiology referral, though patient hesitates due to lack of insurance.  Patient is uninsured, so will get CBC and CMEt to check for electrolyte imbalances. Will defer troponin due to financial status and low risk. Has significant history of substance abuse. DDx: alcohol withdrawal, alcohol induced gastritis, anxiety. Encouraged to attend AA, patient seems unconvinced. Counseled on risk of sudden cardiac death with cocaine. Will treat with Zantac for GERD/gastritis. Have referred to community care coordinator for low cost community resources for substance abuse.  - EKG 12-Lead  2. Chest pain, unspecified type  - CBC with Differential - Comprehensive Metabolic Panel (CMET)  3. Left arm numbness   4. Vomiting, intractability of vomiting not specified, presence of nausea not specified, unspecified vomiting type  - CBC with Differential - Comprehensive Metabolic Panel (CMET)  5. Alcohol abuse  - Ambulatory referral to [facility]  6. History of cocaine abuse  Return if symptoms worsen or fail to improve.  The entirety of the information documented in the History of Present Illness, Review of Systems and Physical Exam  were personally obtained by me. Portions of this information were initially documented by Kyle Graves, CMA and reviewed by me for thoroughness and accuracy.          Kyle SailorsAdriana M [name], PA-C  [facility]

## 2017-07-18 NOTE — Patient Instructions (Signed)
Take Zantac 150 mg in the morning 30 minutes before a meal.     Chest Wall Pain Chest wall pain is pain in or around the bones and muscles of your chest. Sometimes, an injury causes this pain. Sometimes, the cause may not be known. This pain may take several weeks or longer to get better. Follow these instructions at home: Pay attention to any changes in your symptoms. Take these actions to help with your pain:  Rest as told by your doctor.  Avoid activities that cause pain. Try not to use your chest, belly (abdominal), or side muscles to lift heavy things.  If directed, apply ice to the painful area: ? Put ice in a plastic bag. ? Place a towel between your skin and the bag. ? Leave the ice on for 20 minutes, 2-3 times per day.  Take over-the-counter and prescription medicines only as told by your doctor.  Do not use tobacco products, including cigarettes, chewing tobacco, and e-cigarettes. If you need help quitting, ask your doctor.  Keep all follow-up visits as told by your doctor. This is important.  Contact a doctor if:  You have a fever.  Your chest pain gets worse.  You have new symptoms. Get help right away if:  You feel sick to your stomach (nauseous) or you throw up (vomit).  You feel sweaty or light-headed.  You have a cough with phlegm (sputum) or you cough up blood.  You are short of breath. This information is not intended to replace advice given to you by your health care provider. Make sure you discuss any questions you have with your health care provider. Document Released: 02/13/2008 Document Revised: 02/02/2016 Document Reviewed: 11/22/2014 Elsevier Interactive Patient Education  [employer]2018 Elsevier Inc.

## 2017-07-19 DIAGNOSIS — F1010 Alcohol abuse, uncomplicated: Secondary | ICD-10-CM | POA: Insufficient documentation

## 2017-07-19 DIAGNOSIS — F1411 Cocaine abuse, in remission: Secondary | ICD-10-CM | POA: Insufficient documentation

## 2017-07-24 ENCOUNTER — Encounter: Payer: Self-pay | Admitting: Family Medicine

## 2017-09-25 ENCOUNTER — Ambulatory Visit: Payer: BLUE CROSS/BLUE SHIELD | Admitting: Physician Assistant

## 2017-09-25 ENCOUNTER — Encounter: Payer: Self-pay | Admitting: Physician Assistant

## 2017-09-25 VITALS — BP 118/74 | HR 84 | Temp 98.9°F | Resp 16 | Wt 153.0 lb

## 2017-09-25 DIAGNOSIS — J020 Streptococcal pharyngitis: Secondary | ICD-10-CM

## 2017-09-25 LAB — POCT RAPID STREP A (OFFICE): Rapid Strep A Screen: POSITIVE — AB

## 2017-09-25 MED ORDER — AMOXICILLIN 875 MG PO TABS: 875.0000 mg | ORAL_TABLET | Freq: Two times a day (BID) | ORAL | 0 refills | Status: AC

## 2017-09-25 NOTE — Progress Notes (Signed)
Patient: Kyle Graves Male    DOB: [birth year]   22 y.o.   MRN: [id number] Visit Date: 09/25/2017  Today's Provider: Trey Sailors, PA-C   Chief Complaint  Patient presents with  . Sore Throat    Started yesterday   Subjective:    Kyle Graves is a 22 y/o man presenting today with sore throat since yesterday. He has concerns of strep throat, his girlfriend was diagnosed with strep throat yesterday.  Sore Throat   This is a new problem. The current episode started yesterday. The problem has been rapidly worsening. Neither side of throat is experiencing more pain than the other. There has been no fever. Associated symptoms include coughing, swollen glands and trouble swallowing. Pertinent negatives include no abdominal pain, congestion, diarrhea, drooling, ear discharge, ear pain, headaches, hoarse voice, plugged ear sensation, neck pain, shortness of breath, stridor or vomiting. He has had exposure to strep. He has tried NSAIDs for the symptoms. The treatment provided no relief.       No Known Allergies  No current outpatient medications on file.  Review of Systems  Constitutional: Positive for chills, diaphoresis and fatigue. Negative for activity change, appetite change, fever and unexpected weight change.  HENT: Positive for rhinorrhea, sore throat, tinnitus and trouble swallowing. Negative for congestion, drooling, ear discharge, ear pain, hoarse voice, nosebleeds, postnasal drip, sinus pressure, sinus pain and sneezing.   Respiratory: Positive for cough. Negative for apnea, choking, chest tightness, shortness of breath, wheezing and stridor.   Gastrointestinal: Negative.  Negative for abdominal pain, diarrhea and vomiting.  Musculoskeletal: Negative for neck pain.  Neurological: Positive for light-headedness. Negative for dizziness and headaches.    Social History   Tobacco Use  . Smoking status: Current Every Day Smoker    Packs/day: 0.50   Years: 3.00    Pack years: 1.50  . Smokeless tobacco: Never Used  Substance Use Topics  . Alcohol use: Yes    Comment: everyday, beer and liquor average 8 beers a day   Objective:   BP 118/74 (BP Location: Right Arm, Patient Position: Sitting, Cuff Size: Normal)   Pulse 84   Temp 98.9 F (37.2 C) (Oral)   Resp 16   Wt 153 lb (69.4 kg)   BMI 21.95 kg/m  Vitals:   09/25/17 0839  BP: 118/74  Pulse: 84  Resp: 16  Temp: 98.9 F (37.2 C)  TempSrc: Oral  Weight: 153 lb (69.4 kg)     Physical Exam  Constitutional: He appears well-developed and well-nourished.  HENT:  Right Ear: Tympanic membrane and external ear normal.  Left Ear: Tympanic membrane and external ear normal.  Mouth/Throat: Uvula is midline and mucous membranes are normal. Oropharyngeal exudate, posterior oropharyngeal edema and posterior oropharyngeal erythema present. No tonsillar abscesses.  Eyes: Conjunctivae are normal.  Neck: Neck supple.  Cardiovascular: Normal rate and regular rhythm.  Pulmonary/Chest: Effort normal and breath sounds normal.  Lymphadenopathy:    He has no cervical adenopathy.  Skin: Skin is warm and dry.  Psychiatric: He has a normal mood and affect. His behavior is normal.        Assessment & Plan:     1. Strep pharyngitis  - amoxicillin (AMOXIL) 875 MG tablet; Take 1 tablet (875 mg total) by mouth 2 (two) times daily for 7 days.  Dispense: 14 tablet; Refill: 0 - POCT rapid strep A  Return if symptoms worsen or fail to improve.  The entirety of the  information documented in the History of Present Illness, Review of Systems and Physical Exam were personally obtained by me. Portions of this information were initially documented by Kavin LeechLaura Walsh, CMA and reviewed by me for thoroughness and accuracy.         Trey SailorsAdriana M Pollak, PA-C  [facility]

## 2017-09-25 NOTE — Patient Instructions (Signed)

## 2017-10-30 ENCOUNTER — Ambulatory Visit: Payer: BLUE CROSS/BLUE SHIELD | Admitting: Physician Assistant

## 2017-10-30 ENCOUNTER — Encounter: Payer: Self-pay | Admitting: Physician Assistant

## 2017-10-30 ENCOUNTER — Other Ambulatory Visit: Payer: Self-pay | Admitting: Physician Assistant

## 2017-10-30 VITALS — BP 110/74 | HR 60 | Temp 97.6°F | Resp 16 | Wt 158.0 lb

## 2017-10-30 DIAGNOSIS — R079 Chest pain, unspecified: Secondary | ICD-10-CM

## 2017-10-30 NOTE — Patient Instructions (Signed)

## 2017-10-30 NOTE — Progress Notes (Signed)
Patient: Kyle Graves Male    DOB: [birth year]   21 y.o.   MRN: [id number] Visit Date: 10/30/2017  Today's Provider: Trey SailorsAdriana M Pollak, PA-C   Chief Complaint  Patient presents with  . Chest Pain    Follow up   Subjective:    Kyle Graves is a 22 y/o man presenting today for follow up of chest pain. Was seen in clinic three months ago with episode of chest pain. Had called EMS with normal EKG and was not taken to hospital. Presented here the next day. EKG with some irregular rhythm, T-wave inversions but no ST elevation. At the time, he was drinking 8 beers per day. He had used cocaine several months prior. He was smoking. Troponins and cardiology referral were deferred per patient not having insurance.  Since last visit, chest pain decreased x 2 weeks after being seen and then returned. He is taking Zantact twice daily consistently. He reports the chest pain happens once per day for three minutes. It is random. It is not associated with activity. It gets worse with moving his arms but not worse when walking up stairs. He has never vomited with these episodes. The chest pain is located over his left chest. He has had no injuries, not done any heavy lifting. He is not having issues breathing. He denies anxiety. Reports pain decreases when he lies down and breathes deeply.   He has decreased alcohol consumption to two beers per day. He is smoking 1/2 pack per day. He is not using drugs.   Chest Pain   This is a recurrent problem. The quality of the pain is described as dull and heavy. The pain does not radiate. Associated symptoms include diaphoresis and dizziness. Pertinent negatives include no abdominal pain, back pain, cough, exertional chest pressure, fever, headaches, hemoptysis, irregular heartbeat, lower extremity edema, malaise/fatigue, nausea, near-syncope, palpitations, shortness of breath, syncope or weakness. Associated with: Smoking. He has tried rest for the  symptoms. The treatment provided moderate relief.       No Known Allergies   Current Outpatient Medications:  .  ranitidine (ZANTAC) 75 MG tablet, Take 75 mg by mouth 2 (two) times daily., Disp: , Rfl:   Review of Systems  Constitutional: Positive for diaphoresis. Negative for activity change, appetite change, chills, fatigue, fever, malaise/fatigue and unexpected weight change.  Respiratory: Negative.  Negative for cough, hemoptysis and shortness of breath.   Cardiovascular: Positive for chest pain. Negative for palpitations, leg swelling, syncope and near-syncope.  Gastrointestinal: Negative.  Negative for abdominal pain and nausea.  Musculoskeletal: Negative for back pain.  Neurological: Positive for dizziness. Negative for weakness, light-headedness and headaches.    Social History   Tobacco Use  . Smoking status: Current Every Day Smoker    Packs/day: 0.50    Years: 3.00    Pack years: 1.50  . Smokeless tobacco: Never Used  Substance Use Topics  . Alcohol use: Yes    Comment: everyday, beer and liquor average 8 beers a day   Objective:   BP 110/74 (BP Location: Right Arm, Patient Position: Sitting, Cuff Size: Normal)   Pulse 60   Temp 97.6 F (36.4 C) (Oral)   Resp 16   Wt 158 lb (71.7 kg)   SpO2 98%   BMI 22.67 kg/m  Vitals:   10/30/17 0845  BP: 110/74  Pulse: 60  Resp: 16  Temp: 97.6 F (36.4 C)  TempSrc: Oral  SpO2: 98%  Weight:  158 lb (71.7 kg)     Physical Exam  Constitutional: He is oriented to person, place, and time. He appears well-developed and well-nourished.  Cardiovascular: Normal rate and regular rhythm. Exam reveals gallop.  Pulmonary/Chest: Effort normal and breath sounds normal. He exhibits no tenderness.  Neurological: He is alert and oriented to person, place, and time.  Skin: Skin is warm and dry.  Psychiatric: He has a normal mood and affect. His behavior is normal.        Assessment & Plan:     1. Chest pain, unspecified  type  - Ambulatory referral to Cardiology  Return if symptoms worsen or fail to improve.  The entirety of the information documented in the History of Present Illness, Review of Systems and Physical Exam were personally obtained by me. Portions of this information were initially documented by Kavin Leech, CMA and reviewed by me for thoroughness and accuracy.         Trey Sailors, PA-C  [facility]

## 2017-11-22 ENCOUNTER — Emergency Department
Admission: EM | Admit: 2017-11-22 | Discharge: 2017-11-22 | Disposition: A | Discharge status: Home / Self Care | Payer: Self-pay | Attending: Emergency Medicine | Admitting: Emergency Medicine

## 2017-11-22 ENCOUNTER — Encounter: Payer: Self-pay | Admitting: Emergency Medicine

## 2017-11-22 ENCOUNTER — Emergency Department: Payer: Self-pay

## 2017-11-22 DIAGNOSIS — Z5321 Procedure and treatment not carried out due to patient leaving prior to being seen by health care provider: Secondary | ICD-10-CM | POA: Insufficient documentation

## 2017-11-22 DIAGNOSIS — R079 Chest pain, unspecified: Secondary | ICD-10-CM | POA: Insufficient documentation

## 2017-11-22 LAB — BASIC METABOLIC PANEL
Anion gap: 10 (ref 5–15)
BUN: 11 mg/dL (ref 6–20)
CALCIUM: 9 mg/dL (ref 8.9–10.3)
CO2: 25 mmol/L (ref 22–32)
CREATININE: 0.85 mg/dL (ref 0.61–1.24)
Chloride: 103 mmol/L (ref 101–111)
GLUCOSE: 96 mg/dL (ref 65–99)
Potassium: 3.5 mmol/L (ref 3.5–5.1)
Sodium: 138 mmol/L (ref 135–145)

## 2017-11-22 LAB — CBC
HCT: 44.8 % (ref 40.0–52.0)
Hemoglobin: 15.8 g/dL (ref 13.0–18.0)
MCH: 30.8 pg (ref 26.0–34.0)
MCHC: 35.3 g/dL (ref 32.0–36.0)
MCV: 87.1 fL (ref 80.0–100.0)
PLATELETS: 200 10*3/uL (ref 150–440)
RBC: 5.14 MIL/uL (ref 4.40–5.90)
RDW: 13.8 % (ref 11.5–14.5)
WBC: 5.4 10*3/uL (ref 3.8–10.6)

## 2017-11-22 LAB — TROPONIN I

## 2017-11-22 NOTE — ED Notes (Signed)
Patient refusing to be seen by EDP at this time.  Patient and family advised to stay for evaluation, but refused at this time.  No signature able to be obtained.

## 2017-11-22 NOTE — ED Notes (Signed)
Patient transported to X-ray 

## 2017-11-22 NOTE — ED Triage Notes (Signed)
Patient co left sided non radiating chest pain that ha has had " for a while now"  Pt denies any activity related injuries to explain the pain.  He states his family has had some cardiac problems.  He states he has had some sweating, numbness on left arm and at times feels like he is going to pass out.

## 2017-11-25 ENCOUNTER — Telehealth: Payer: Self-pay | Admitting: Emergency Medicine

## 2017-11-25 NOTE — Telephone Encounter (Signed)
Called patient due to lwot to inquire about condition and follow up plans. Left message.   

## 2018-01-15 ENCOUNTER — Ambulatory Visit (INDEPENDENT_AMBULATORY_CARE_PROVIDER_SITE_OTHER): Payer: 59 | Admitting: Physician Assistant

## 2018-01-15 ENCOUNTER — Encounter: Payer: Self-pay | Admitting: Physician Assistant

## 2018-01-15 VITALS — BP 124/86 | HR 68 | Temp 98.1°F | Resp 16 | Wt 159.0 lb

## 2018-01-15 DIAGNOSIS — Z113 Encounter for screening for infections with a predominantly sexual mode of transmission: Secondary | ICD-10-CM | POA: Diagnosis not present

## 2018-01-15 NOTE — Patient Instructions (Signed)

## 2018-01-15 NOTE — Progress Notes (Signed)
       Patient: Kyle Graves Male    DOB: 01-24-1996   21 y.o.   MRN: [id number] Visit Date: 01/15/2018  Today's Provider: Trey Sailors, PA-C   Chief Complaint  Patient presents with  . Exposure to STD   Subjective:    HPI  Kyle Graves is a 22 year old male presenting today for STI screening. Reports he has multiple male partners. He uses condoms for all but one of these partners. He has not been exposed to STI that he knows of. Denies any genital lesions or areas of concern. Has never had STI.      No Known Allergies   Current Outpatient Medications:  .  ranitidine (ZANTAC) 75 MG tablet, Take 75 mg by mouth 2 (two) times daily., Disp: , Rfl:   Review of Systems  Constitutional: Negative.   Genitourinary: Negative.   Neurological: Negative for dizziness, light-headedness and headaches.    Social History   Tobacco Use  . Smoking status: Current Every Day Smoker    Packs/day: 0.50    Years: 3.00    Pack years: 1.50  . Smokeless tobacco: Never Used  Substance Use Topics  . Alcohol use: Yes    Comment: everyday, beer and liquor average 8 beers a day   Objective:   BP 124/86 (BP Location: Right Arm, Patient Position: Sitting, Cuff Size: Normal)   Pulse 68   Temp 98.1 F (36.7 C) (Oral)   Resp 16   Wt 159 lb (72.1 kg)   BMI 22.81 kg/m  Vitals:   01/15/18 0850  BP: 124/86  Pulse: 68  Resp: 16  Temp: 98.1 F (36.7 C)  TempSrc: Oral  Weight: 159 lb (72.1 kg)     Physical Exam  Constitutional: He is oriented to person, place, and time. He appears well-developed and well-nourished.  Cardiovascular: Normal rate.  Pulmonary/Chest: Effort normal.  Neurological: He is alert and oriented to person, place, and time.  Skin: Skin is warm and dry.  Psychiatric: He has a normal mood and affect. His behavior is normal.        Assessment & Plan:     1. Routine screening for STI (sexually transmitted infection)  -  Chlamydia/Gonococcus/Trichomonas, NAA - HIV antibody (with reflex) - RPR - Hepatitis c antibody (reflex)  Return if symptoms worsen or fail to improve.  The entirety of the information documented in the History of Present Illness, Review of Systems and Physical Exam were personally obtained by me. Portions of this information were initially documented by Kavin Leech, CMA and reviewed by me for thoroughness and accuracy.         Trey Sailors, PA-C  [facility]

## 2018-01-16 LAB — HEPATITIS C ANTIBODY (REFLEX): HCV Ab: <0.1 s/co ratio (ref 0.0–0.9)

## 2018-01-16 LAB — RPR: RPR Ser Ql: NONREACTIVE

## 2018-01-16 LAB — CHLAMYDIA/GONOCOCCUS/TRICHOMONAS, NAA
Chlamydia by NAA: NEGATIVE
Gonococcus by NAA: NEGATIVE
Trich vag by NAA: NEGATIVE

## 2018-01-16 LAB — HIV ANTIBODY (ROUTINE TESTING W REFLEX): HIV Screen 4th Generation wRfx: NONREACTIVE

## 2018-01-16 LAB — HCV COMMENT:

## 2018-01-17 ENCOUNTER — Telehealth: Payer: Self-pay

## 2018-01-17 NOTE — Telephone Encounter (Signed)
-----   Message from Trey Sailors, [place] sent at 01/17/2018 11:23 AM EDT ----- All STI screening negative.

## 2018-01-17 NOTE — Telephone Encounter (Signed)
Patient has been advised. KW 

## 2018-03-21 ENCOUNTER — Encounter: Payer: Self-pay | Admitting: Family Medicine

## 2018-03-21 ENCOUNTER — Ambulatory Visit (INDEPENDENT_AMBULATORY_CARE_PROVIDER_SITE_OTHER): Payer: 59 | Admitting: Family Medicine

## 2018-03-21 VITALS — BP 122/90 | HR 56 | Temp 97.5°F | Resp 16 | Wt 163.0 lb

## 2018-03-21 DIAGNOSIS — N63 Unspecified lump in unspecified breast: Secondary | ICD-10-CM

## 2018-03-21 NOTE — Patient Instructions (Signed)
Breast Ultrasound Breast ultrasound, or breast ultrasonogram, is a procedure that is used to check for breast problems. A breast ultrasound uses harmless sound waves and a computer to create pictures of the inside of your breast. This procedure can help determine whether a lump in your breast is a fluid-filled sac (cyst), a solid tumor, or a growth. It can also help determine whether a tumor is cancerous (malignant) or noncancerous (benign). Sometimes a breast ultrasound is done to locate nodules in the breast that are too small to feel but need to be removed during surgery. A breast ultrasound takes about 30 minutes. Tell a health care provider about:  Any allergies you have.  All medicines you are taking, including vitamins, herbs, eye drops, creams, and over-the-counter medicines.  Any blood disorders you have.  Any surgeries you have had.  Any medical conditions you have. What are the risks? Breast ultrasound is safe and painless. Ultrasound imaging does not use X-rays. There are no known risks for this procedure. What happens before the procedure? You do not need to prepare for a breast ultrasound. You will undress from your waist up, so wear loose, comfortable clothing on the day of the procedure. What happens during the procedure?  You will lie down on an examining table.  A health care provider will apply gel to your breast area.  You may be asked to hold your arm above your head.  The health care provider will move a handheld probe, which looks like a microphone, back and forth over your breast.  The probe will send signals to a computer that will create images of your breast.  When the procedure is over, the gel will be cleaned from your breast, and you can get dressed. What happens after the procedure?  You can go home right away and do all of your usual activities.  It is up to you to get the results of your procedure. Ask your health care provider, or the department  that is doing the procedure, when your results will be ready. Summary  A breast ultrasound is a procedure that is used to check for breast problems.  This procedure uses harmless sound waves and a computer to create pictures of the inside of your breast.  This is a safe procedure. This information is not intended to replace advice given to you by your health care provider. Make sure you discuss any questions you have with your health care provider. Document Released: 09/18/2004 Document Revised: 05/03/2016 Document Reviewed: 04/30/2016 Elsevier Interactive Patient Education  2017 [employer] Inc.  

## 2018-03-21 NOTE — Progress Notes (Signed)
Patient: Kyle Graves Male    DOB: 19-Dec-1995   22 y.o.   MRN: [id number] Visit Date: 03/21/2018  Today's Provider: Shirlee Latch, MD   I, Joslyn Hy, CMA, am acting as scribe for Shirlee Latch, MD.  Chief Complaint  Patient presents with  . Mass   Subjective:    HPI   Pt is concerned about a mass on his left "nipple". He states he just noticed this ~2-3 days ago, and it has started hurting. States his nipple has changed color.  He has not had any nipple drainage, redness of the area, fever, axillary adenopathy, weight loss, night sweats.  No family history of breast cancer.   He is also c/o nasal congestion and diarrhea.  No Known Allergies   Current Outpatient Medications:  .  ranitidine (ZANTAC) 75 MG tablet, Take 75 mg by mouth 2 (two) times daily., Disp: , Rfl:   Review of Systems  Constitutional: Positive for chills. Negative for activity change, appetite change, diaphoresis, fatigue, fever and unexpected weight change.  HENT: Positive for congestion.   Gastrointestinal: Positive for diarrhea.  Skin: Positive for color change.    Social History   Tobacco Use  . Smoking status: Current Every Day Smoker    Packs/day: 0.25    Years: 3.00    Pack years: 0.75  . Smokeless tobacco: Never Used  . Tobacco comment: Smokes about 2-3 cigarettes daily  Substance Use Topics  . Alcohol use: Yes    Comment: everyday, beer and liquor average 8 beers a day   Objective:   BP 122/90 (BP Location: Left Arm, Patient Position: Sitting, Cuff Size: Normal)   Pulse (!) 56   Temp (!) 97.5 F (36.4 C) (Oral)   Resp 16   Wt 163 lb (73.9 kg)   SpO2 99%   BMI 23.39 kg/m  Vitals:   03/21/18 0948  BP: 122/90  Pulse: (!) 56  Resp: 16  Temp: (!) 97.5 F (36.4 C)  TempSrc: Oral  SpO2: 99%  Weight: 163 lb (73.9 kg)     Physical Exam  Constitutional: He is oriented to person, place, and time. He appears well-developed and well-nourished. No  distress.  HENT:  Head: Normocephalic and atraumatic.  Eyes: Conjunctivae are normal. No scleral icterus.  Neck: Neck supple. No thyromegaly present.  Cardiovascular: Normal rate, regular rhythm, normal heart sounds and intact distal pulses.  No murmur heard. Pulmonary/Chest: Effort normal and breath sounds normal. No respiratory distress. He has no wheezes. He has no rales.  Abdominal: Soft. He exhibits no distension. There is no tenderness.  Genitourinary:  Genitourinary Comments: Breasts: right breast normal without mass, skin or nipple changes or axillary nodes, left breast with tender, mobile 1cm mass deep to nipple, areola is lighter in color than right side without skin changes or axillary nodes.   Musculoskeletal: He exhibits no edema or tenderness.  Lymphadenopathy:    He has no cervical adenopathy.  Neurological: He is alert and oriented to person, place, and time.  Skin: Skin is warm and dry. Capillary refill takes less than 2 seconds.  Psychiatric: He has a normal mood and affect. His behavior is normal.  Vitals reviewed.      Assessment & Plan:   1. Breast mass in male - does have palpable mass deep to L nipple - low risk for breast cancer - does not seem to be infectious - will obtain US Breast to evaluate further - in the meantime, recommend  warm compresses and tylenol/ibuprofen - US BREAST COMPLETE UNI LEFT INC AXILLA; Future   Return if symptoms worsen or fail to improve.   The entirety of the information documented in the History of Present Illness, Review of Systems and Physical Exam were personally obtained by me. Portions of this information were initially documented by Irving BurtonEmily Ratchford, CMA and reviewed by me for thoroughness and accuracy.    Erasmo DownerBacigalupo, Coston Mandato M, MD, MPH [facility] 03/21/2018 10:35 AM

## 2018-03-25 NOTE — Addendum Note (Signed)
Addended by: Erasmo DownerBACIGALUPO, Saleha Kalp M on: 03/25/2018 09:56 AM   Modules accepted: Orders

## 2018-03-27 ENCOUNTER — Telehealth: Payer: Self-pay

## 2018-03-27 ENCOUNTER — Ambulatory Visit
Admission: RE | Admit: 2018-03-27 | Discharge: 2018-03-27 | Disposition: A | Discharge status: Home / Self Care | Payer: 59 | Source: Ambulatory Visit | Attending: Family Medicine | Admitting: Family Medicine

## 2018-03-27 DIAGNOSIS — N63 Unspecified lump in unspecified breast: Secondary | ICD-10-CM | POA: Diagnosis present

## 2018-03-27 MED ORDER — FAMOTIDINE 20 MG PO TABS: 20.0000 mg | ORAL_TABLET | Freq: Two times a day (BID) | ORAL | Status: DC

## 2018-03-27 NOTE — Telephone Encounter (Signed)
-----   Message from Erasmo DownerAngela M Bacigalupo, MD sent at 03/27/2018 10:50 AM EDT ----- Breast lump is a benign finding called gynecomastia.  This means breast enlargement.  This could be caused by many things, including anti-reflux medicines like ranitidine and omeprazole, as well as marijuana use.  I recommend the patient switching from ranitidine to Pepcid if he is still taking this.  Erasmo DownerBacigalupo, Angela M, MD, MPH [facility] 03/27/2018 10:50 AM

## 2018-03-27 NOTE — Telephone Encounter (Signed)
Pt advised, and agrees with plan. Zantac D/C from med list, and pepcid added.

## 2018-08-11 ENCOUNTER — Encounter: Payer: Self-pay | Admitting: Family Medicine

## 2018-08-11 ENCOUNTER — Ambulatory Visit (INDEPENDENT_AMBULATORY_CARE_PROVIDER_SITE_OTHER): Payer: 59 | Admitting: Family Medicine

## 2018-08-11 VITALS — BP 120/86 | HR 81 | Temp 98.1°F | Wt 168.4 lb

## 2018-08-11 DIAGNOSIS — K148 Other diseases of tongue: Secondary | ICD-10-CM

## 2018-08-11 NOTE — Progress Notes (Signed)
Patient: Kyle Graves Male    DOB: [birth year]   22 y.o.   MRN: [id number] Visit Date: 08/11/2018  Today's Provider: Shirlee Latch, MD   Chief Complaint  Patient presents with  . Tongue Discoloration   Subjective:    I, Presley Raddle, CMA, am acting as a scribe for Shirlee Latch, MD.   HPI Patient presents today C/O discoloration of the tongue. He reports his tongue is orange colored the past week. He states after brushing his teeth his tongue returns to normal color. He denies eating anything orange colored and pain.  He googled it and read about liver issues and thrush, so he was concerned and wanted to be checked out  Quit smoking ~1 month ago as he has a newborn.    No Known Allergies   Current Outpatient Medications:  .  famotidine (PEPCID) 20 MG tablet, Take 1 tablet (20 mg total) by mouth 2 (two) times daily., Disp: , Rfl:   Review of Systems  Constitutional: Negative.   HENT: Negative.   Respiratory: Negative.   Cardiovascular: Negative.   Gastrointestinal: Negative.   Musculoskeletal: Negative.   Neurological: Negative.   Psychiatric/Behavioral: Negative.     Social History   Tobacco Use  . Smoking status: Former Smoker    Packs/day: 0.25    Years: 3.00    Pack years: 0.75    Last attempt to quit: 06/10/2018    Years since quitting: 0.1  . Smokeless tobacco: Never Used  Substance Use Topics  . Alcohol use: Yes    Comment: everyday, beer and liquor average 8 beers a day   Objective:   BP 120/86 (BP Location: Right Arm, Patient Position: Sitting, Cuff Size: Normal)   Pulse 81   Temp 98.1 F (36.7 C) (Oral)   Wt 168 lb 6.4 oz (76.4 kg)   SpO2 99%   BMI 24.16 kg/m  Vitals:   08/11/18 0813  BP: 120/86  Pulse: 81  Temp: 98.1 F (36.7 C)  TempSrc: Oral  SpO2: 99%  Weight: 168 lb 6.4 oz (76.4 kg)     Physical Exam  Constitutional: He is oriented to person, place, and time. He appears well-developed and well-nourished.  No distress.  HENT:  Head: Normocephalic and atraumatic.  Right Ear: External ear normal.  Left Ear: External ear normal.  Nose: Nose normal.  Mouth/Throat: Uvula is midline, oropharynx is clear and moist and mucous membranes are normal. No oral lesions. No oropharyngeal exudate.  Tongue with mild yellow-discoloration  Eyes: Pupils are equal, round, and reactive to light. Conjunctivae are normal. Right eye exhibits no discharge. Left eye exhibits no discharge. No scleral icterus.  Neck: Neck supple. No thyromegaly present.  Cardiovascular: Normal rate, regular rhythm and normal heart sounds.  No murmur heard. Pulmonary/Chest: Effort normal and breath sounds normal. No respiratory distress. He has no wheezes. He has no rales.  Lymphadenopathy:    He has no cervical adenopathy.  Neurological: He is alert and oriented to person, place, and time.  Skin: Skin is warm and dry. Capillary refill takes less than 2 seconds. No rash noted.  Psychiatric: He has a normal mood and affect. His behavior is normal.  Vitals reviewed.       Assessment & Plan:   1. Tongue discoloration - new problem - could have been related to smoking but he has quit - congratulated him on this - likely related to GERD vs something he eats - reassured that systemic issues  or thrush would not rub off when brushing - continue to brush BID - may take pepcid more frequently to see if it improves   Return if symptoms worsen or fail to improve.   The entirety of the information documented in the History of Present Illness, Review of Systems and Physical Exam were personally obtained by me. Portions of this information were initially documented by Presley RaddleNikki Walston, CMA and reviewed by me for thoroughness and accuracy.    Erasmo DownerBacigalupo, Britania Shreeve M, MD, MPH [facility] 08/11/2018 8:27 AM

## 2018-08-21 ENCOUNTER — Ambulatory Visit (INDEPENDENT_AMBULATORY_CARE_PROVIDER_SITE_OTHER): Payer: 59 | Admitting: Physician Assistant

## 2018-08-21 ENCOUNTER — Encounter: Payer: Self-pay | Admitting: Physician Assistant

## 2018-08-21 VITALS — BP 113/80 | HR 71 | Temp 98.2°F | Wt 168.4 lb

## 2018-08-21 DIAGNOSIS — J069 Acute upper respiratory infection, unspecified: Secondary | ICD-10-CM | POA: Diagnosis not present

## 2018-08-21 DIAGNOSIS — B9789 Other viral agents as the cause of diseases classified elsewhere: Secondary | ICD-10-CM

## 2018-08-21 NOTE — Patient Instructions (Signed)

## 2018-08-21 NOTE — Progress Notes (Signed)
Patient: Kyle Graves Male    DOB: November 02, 1995   22 y.o.   MRN: [id number] Visit Date: 08/21/2018  Today's Provider: Trey Sailors, PA-C   Chief Complaint  Patient presents with  . URI   Subjective:    HPI  Upper Respiratory Infection Patient presents today for URI symptoms for about 6 days. Patient is having the following symptoms: sore throat, fever and nausea. Fever as high as 101.5. Patient states he has taking DyQuil. Patient actually feels better today. Denies productive cough, myalgias.     No Known Allergies   Current Outpatient Medications:  .  famotidine (PEPCID) 20 MG tablet, Take 1 tablet (20 mg total) by mouth 2 (two) times daily., Disp: , Rfl:   Review of Systems  Constitutional: Positive for fever.  HENT: Positive for sore throat.   Respiratory: Negative.   Gastrointestinal: Positive for nausea.  Genitourinary: Negative.   Neurological: Negative.     Social History   Tobacco Use  . Smoking status: Former Smoker    Packs/day: 0.25    Years: 3.00    Pack years: 0.75    Last attempt to quit: 06/10/2018    Years since quitting: 0.1  . Smokeless tobacco: Never Used  Substance Use Topics  . Alcohol use: Yes    Comment: everyday, beer and liquor average 8 beers a day   Objective:   BP 113/80 (BP Location: Right Arm, Patient Position: Sitting, Cuff Size: Normal)   Pulse 71   Temp 98.2 F (36.8 C) (Oral)   Wt 168 lb 6.4 oz (76.4 kg)   SpO2 98%   BMI 24.16 kg/m  Vitals:   08/21/18 0853  BP: 113/80  Pulse: 71  Temp: 98.2 F (36.8 C)  TempSrc: Oral  SpO2: 98%  Weight: 168 lb 6.4 oz (76.4 kg)     Physical Exam Constitutional:      General: He is not in acute distress.    Appearance: He is well-developed. He is not diaphoretic.  HENT:     Right Ear: Tympanic membrane and external ear normal.     Left Ear: Tympanic membrane and external ear normal.     Nose: Rhinorrhea present.     Right Sinus: No maxillary sinus  tenderness or frontal sinus tenderness.     Left Sinus: No maxillary sinus tenderness or frontal sinus tenderness.     Mouth/Throat:     Pharynx: Uvula midline. No oropharyngeal exudate.  Eyes:     General:        Right eye: Discharge present.        Left eye: Discharge present.    Conjunctiva/sclera: Conjunctivae normal.     Comments: Watery Discharge   Neck:     Musculoskeletal: Normal range of motion and neck supple.  Cardiovascular:     Rate and Rhythm: Normal rate and regular rhythm.  Pulmonary:     Effort: Pulmonary effort is normal. No respiratory distress.     Breath sounds: Normal breath sounds. No wheezing or rales.  Lymphadenopathy:     Cervical: No cervical adenopathy.  Skin:    General: Skin is warm and dry.  Neurological:     Mental Status: He is alert and oriented to person, place, and time.  Psychiatric:        Behavior: Behavior normal.         Assessment & Plan:     1. Viral URI with cough  Counseled regarding signs and symptoms  of viral and bacterial respiratory infections. Advised to call or return for additional evaluation if he develops any sign of bacterial infection, or if current symptoms last longer than 10 days. Counseled on sx tx including Delsym, mucinex.  Return if symptoms worsen or fail to improve.  The entirety of the information documented in the History of Present Illness, Review of Systems and Physical Exam were personally obtained by me. Portions of this information were initially documented by Dara LordsPorsha Carpenter, CMA and reviewed by me for thoroughness and accuracy.              Trey SailorsAdriana M Ell Tiso, PA-C  [facility]

## 2018-11-10 IMAGING — US US BREAST*L* LIMITED INC AXILLA
1 series · 4 of 4 positions shown · non-contrast
Comparison: None.

CLINICAL DATA: 22-year-old male presenting for evaluation of a
tender palpable retroareolar left breast lump.

EXAM:
ULTRASOUND OF THE LEFT BREAST

[Series 1: us breast*left* limited inc axilla · 0.06mm/px · 4 of 4 slices shown]
[im 1/4]
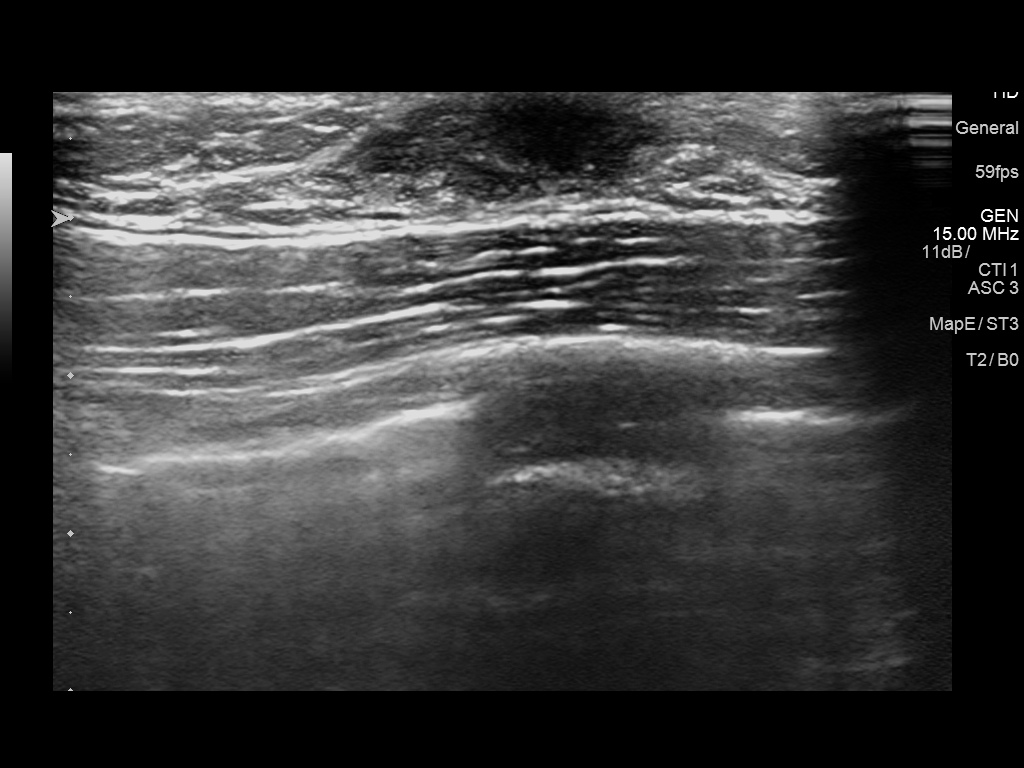
[im 2/4]
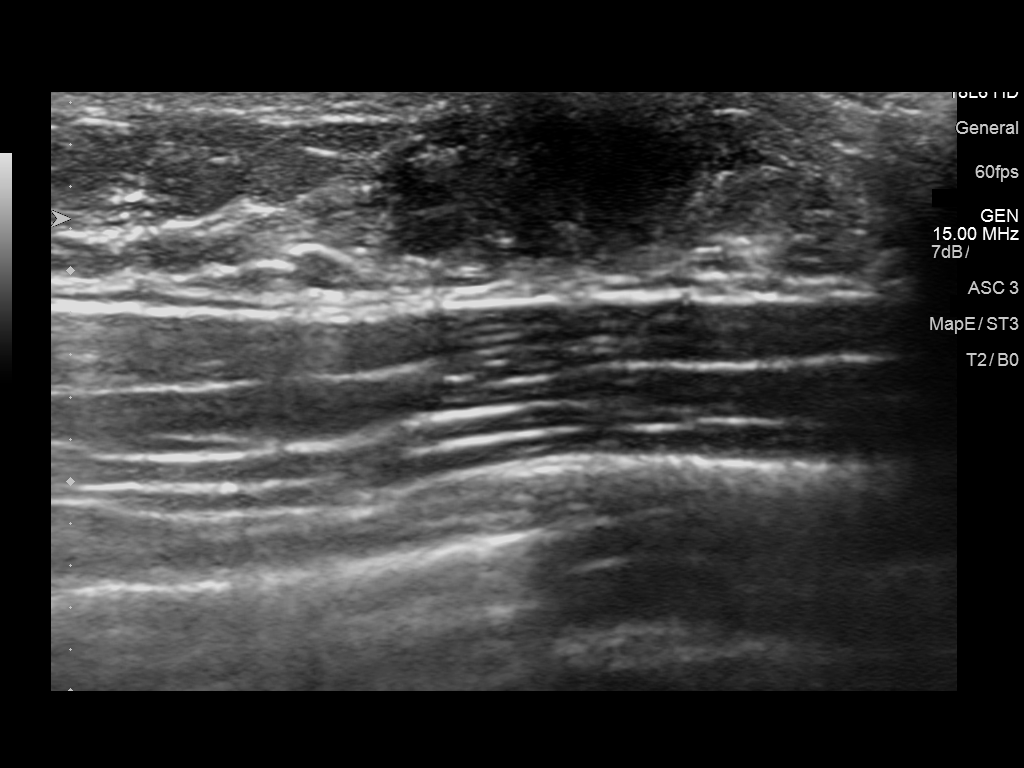
[im 3/4]
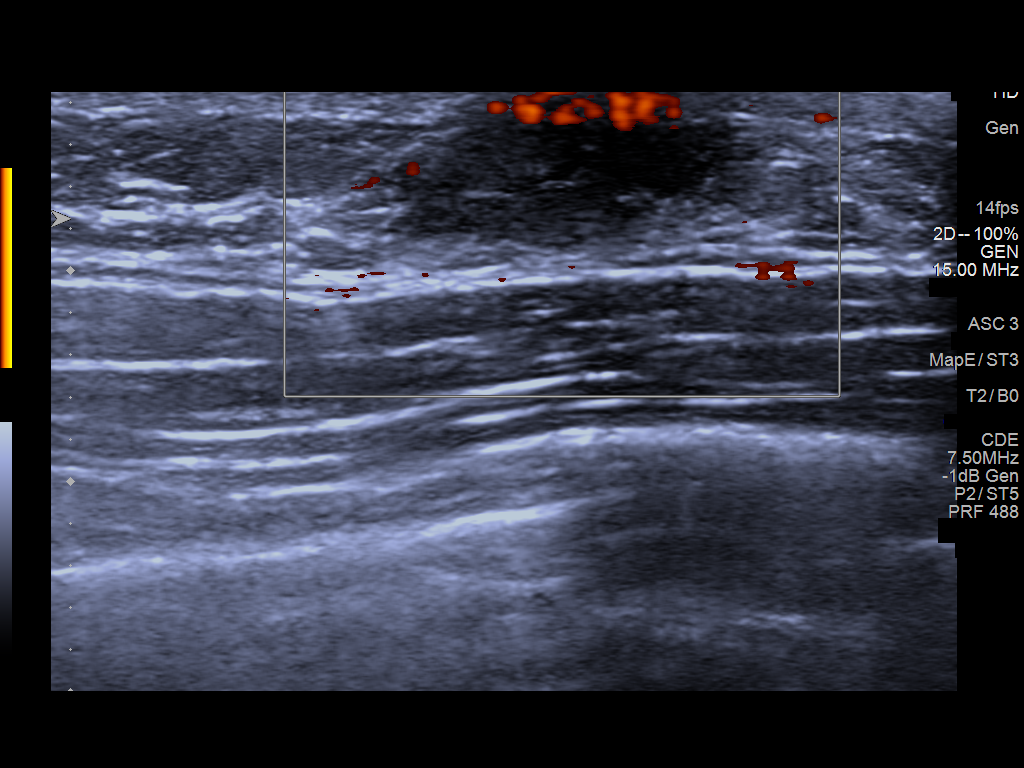
[im 4/4]
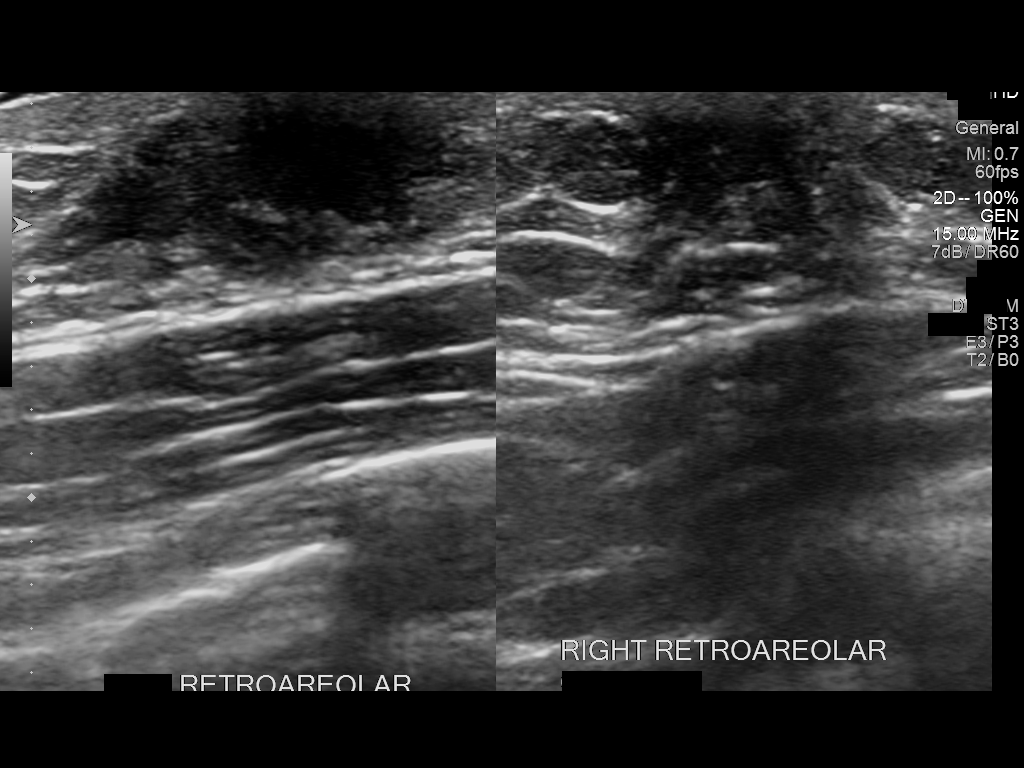

[4 of 4 positions shown; findings below may reference images not displayed]

FINDINGS: On physical exam, there is a slight thickening of the tissue behind
the left nipple, without discrete palpable mass. This is more
pronounced than on the right.

Targeted ultrasound is performed, showing dense fibroglandular
tissue immediately posterior to the left nipple. There is similar
but less of this tissue in the retroareolar right breast which was
imaged for comparison purposes.
IMPRESSION: The palpable tender lump in the retroareolar left breast is
compatible with benign gynecomastia.

RECOMMENDATION:
Clinical follow-up recommended for symptomatic gynecomastia.

I have discussed the findings and recommendations with the patient.
Results were also provided in writing at the conclusion of the
visit. If applicable, a reminder letter will be sent to the patient
regarding the next appointment.

BI-RADS CATEGORY  2: Benign.

## 2019-02-15 IMAGING — CR DG CHEST 2V
1 series · 2 of 2 positions shown · non-contrast
Comparison: None.

CLINICAL DATA: 21 y/o  M; chest pain.

EXAM:
CHEST - 2 VIEW

[Series 1: w chest pa · 0.14mm/px · 2 of 2 slices shown]
[im 1/2]
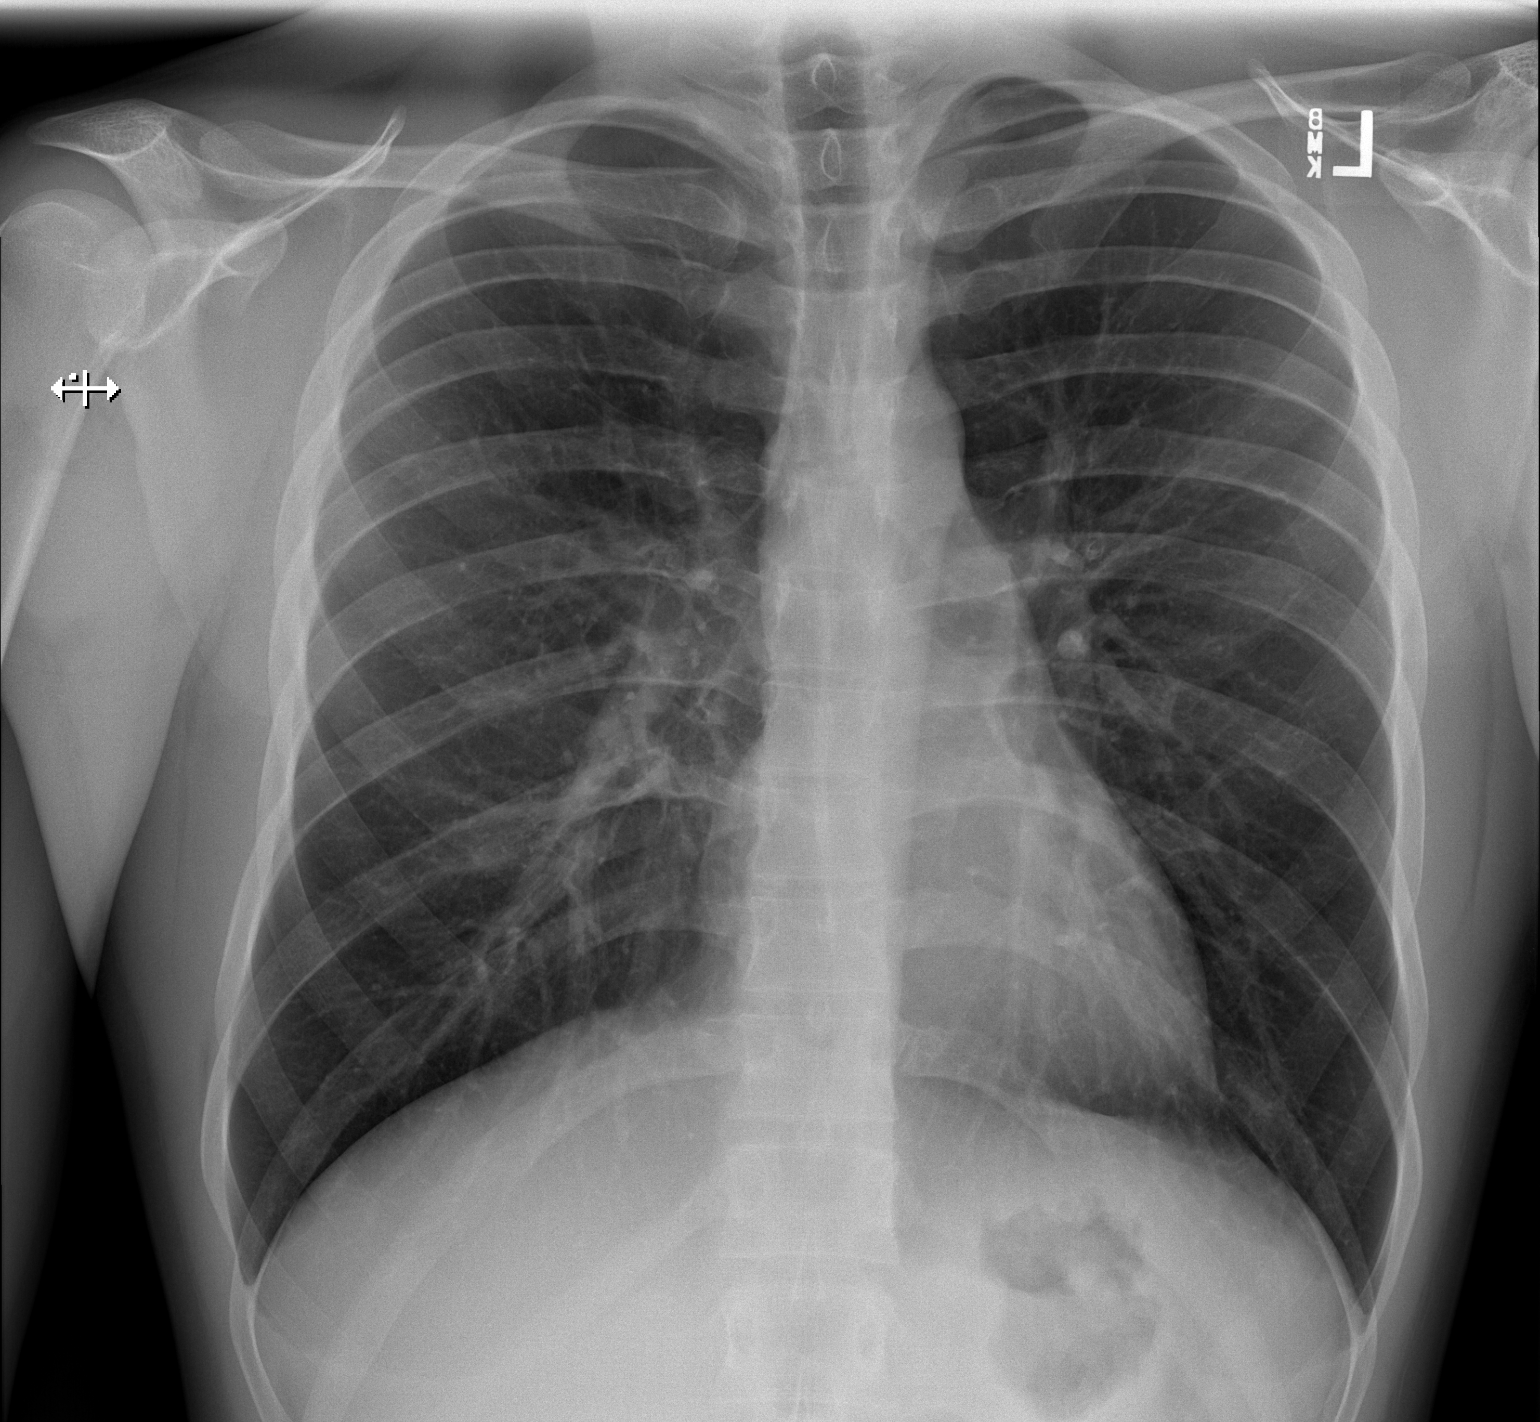
[im 2/2]
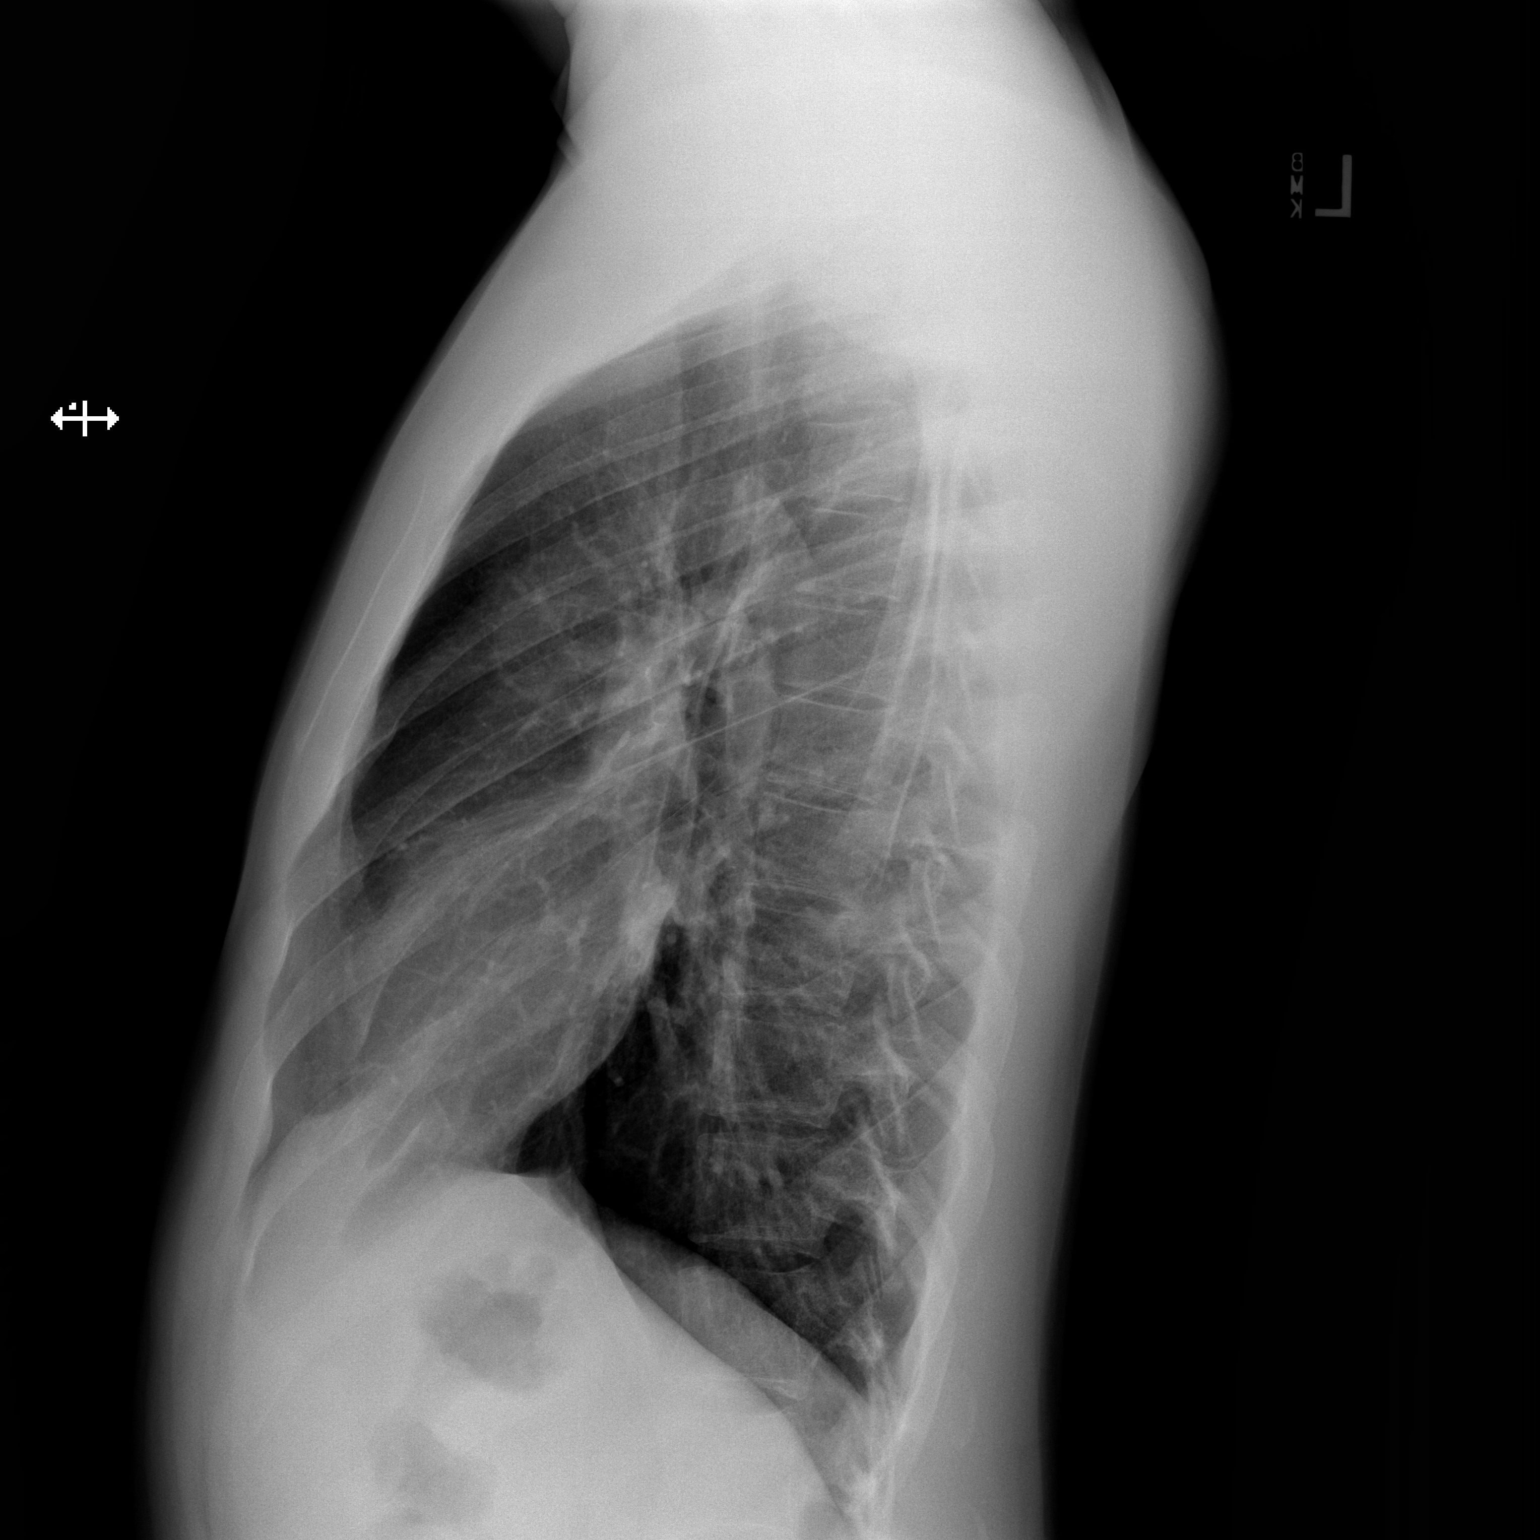

[2 of 2 positions shown; findings below may reference images not displayed]

FINDINGS: The heart size and mediastinal contours are within normal limits.
Both lungs are clear. The visualized skeletal structures are
unremarkable.
IMPRESSION: No active cardiopulmonary disease.

By: Neseul Baselais M.D.

## 2019-04-02 ENCOUNTER — Other Ambulatory Visit: Payer: Self-pay

## 2019-04-02 ENCOUNTER — Telehealth: Payer: Self-pay

## 2019-04-02 ENCOUNTER — Encounter: Payer: Self-pay | Admitting: Family Medicine

## 2019-04-02 ENCOUNTER — Ambulatory Visit (INDEPENDENT_AMBULATORY_CARE_PROVIDER_SITE_OTHER): Payer: 59 | Admitting: Family Medicine

## 2019-04-02 VITALS — BP 118/64 | HR 76 | Temp 98.6°F | Resp 16 | Wt 175.0 lb

## 2019-04-02 DIAGNOSIS — K648 Other hemorrhoids: Secondary | ICD-10-CM | POA: Diagnosis not present

## 2019-04-02 MED ORDER — HYDROCORTISONE ACETATE 25 MG RE SUPP: 25.0000 mg | Freq: Every evening | RECTAL | 1 refills | Status: DC | PRN

## 2019-04-02 NOTE — Telephone Encounter (Signed)
Patient called stating that he is having blood in his stool. Patient states that he have it often when he wipes but never had it come out with his stool. I advised patient to come in office for evaluation due to patient stating that he does not think it is that serious. FYI

## 2019-04-02 NOTE — Patient Instructions (Signed)
Recommend taking hot baths.

## 2019-04-02 NOTE — Progress Notes (Signed)
Patient: Kyle Graves Male    DOB: 11-Oct-1995   23 y.o.   MRN: [id number] Visit Date: 04/02/2019  Today's Provider: Wilhemena Durie, MD   Chief Complaint  Patient presents with  . Blood In Stools   Subjective:    HPI  Patient comes in today c/o blood in his stools. He reports that this started today. He denies having symptoms of hemorrhoids or constipation. He does eat a lot of red meat. He denies nausea, vomiting, or abdominal pain.  No fevers or weight loss. Energy and appetite are good. BP Readings from Last 3 Encounters:  04/02/19 118/64  08/21/18 113/80  08/11/18 120/86   Wt Readings from Last 3 Encounters:  04/02/19 175 lb (79.4 kg)  08/21/18 168 lb 6.4 oz (76.4 kg)  08/11/18 168 lb 6.4 oz (76.4 kg)     No Known Allergies   Current Outpatient Medications:  .  famotidine (PEPCID) 20 MG tablet, Take 1 tablet (20 mg total) by mouth 2 (two) times daily. (Patient not taking: Reported on 04/02/2019), Disp: , Rfl:   Review of Systems  Constitutional: Negative for activity change, appetite change, fatigue and unexpected weight change.  Cardiovascular: Negative for chest pain, palpitations and leg swelling.  Gastrointestinal: Positive for blood in stool. Negative for abdominal distention, abdominal pain, anal bleeding, constipation, diarrhea, nausea, rectal pain and vomiting.  Endocrine: Negative for cold intolerance, heat intolerance, polydipsia, polyphagia and polyuria.  Neurological: Negative for dizziness, light-headedness and headaches.  Psychiatric/Behavioral: Negative for agitation, self-injury, sleep disturbance and suicidal ideas. The patient is not nervous/anxious.     Social History   Tobacco Use  . Smoking status: Former Smoker    Packs/day: 0.25    Years: 3.00    Pack years: 0.75    Quit date: 06/10/2018    Years since quitting: 0.8  . Smokeless tobacco: Never Used  Substance Use Topics  . Alcohol use: Yes    Comment: everyday, beer  and liquor average 8 beers a day      Objective:   BP 118/64   Pulse 76   Temp 98.6 F (37 C)   Resp 16   Wt 175 lb (79.4 kg)   SpO2 99%   BMI 25.11 kg/m  Vitals:   04/02/19 1456  BP: 118/64  Pulse: 76  Resp: 16  Temp: 98.6 F (37 C)  SpO2: 99%  Weight: 175 lb (79.4 kg)     Physical Exam Vitals signs reviewed.  Constitutional:      Appearance: Normal appearance.  HENT:     Head: Normocephalic and atraumatic.     Right Ear: External ear normal.     Left Ear: External ear normal.  Eyes:     General: No scleral icterus.    Conjunctiva/sclera: Conjunctivae normal.  Cardiovascular:     Heart sounds: Normal heart sounds.  Pulmonary:     Breath sounds: Normal breath sounds.  Abdominal:     Palpations: Abdomen is soft.  Genitourinary:    Comments: Perianal area reveal small protruding internal hemorrhoid. Neurological:     Mental Status: He is alert and oriented to person, place, and time.  Psychiatric:        Mood and Affect: Mood normal.        Behavior: Behavior normal.        Thought Content: Thought content normal.        Judgment: Judgment normal.      No results found for  any visits on 04/02/19.     Assessment & Plan    1. Internal hemorrhoid, bleeding Treat with sitz bath and suppository. May need GI referral if bleeding persists. - hydrocortisone (ANUSOL-HC) 25 MG suppository; Place 1 suppository (25 mg total) rectally at bedtime as needed for hemorrhoids.  Dispense: 12 suppository; Refill: 1     Richard Wendelyn BreslowGilbert Jr, MD  [facility]

## 2019-07-21 ENCOUNTER — Telehealth: Payer: Self-pay | Admitting: *Deleted

## 2019-07-21 NOTE — Telephone Encounter (Signed)
Please review. Thanks!  

## 2019-07-21 NOTE — Telephone Encounter (Signed)
Patient was contacted and he was at an urgent care being seen. He went for a covid test yesterday and it was negative.

## 2019-07-21 NOTE — Telephone Encounter (Signed)
covid test needed

## 2019-07-21 NOTE — Telephone Encounter (Signed)
Patient called office to schedule an appt. However he is having sore throat, fever 101.7 and body aches. Patient was tested for covid (within the last week) and it was neg. Patient was offered a virtual visit, however he wanted to ask Dr. Rosanna Randy first about what he can take to help with symptoms?  Patient has been taking ibuprofen which has only helped to lower tempeture. Please advise?

## 2019-11-05 ENCOUNTER — Ambulatory Visit (INDEPENDENT_AMBULATORY_CARE_PROVIDER_SITE_OTHER): Payer: 59 | Admitting: Physician Assistant

## 2019-11-05 DIAGNOSIS — Z5329 Procedure and treatment not carried out because of patient's decision for other reasons: Secondary | ICD-10-CM

## 2019-11-05 DIAGNOSIS — Z91199 Patient's noncompliance with other medical treatment and regimen due to unspecified reason: Secondary | ICD-10-CM

## 2019-11-05 NOTE — Progress Notes (Signed)
Called at 2:21 PM and 2:29 PM. Left voicemail and no answer x 2. NO SHOW.

## 2020-11-15 ENCOUNTER — Other Ambulatory Visit: Payer: Self-pay

## 2020-11-15 ENCOUNTER — Telehealth: Payer: Self-pay | Admitting: *Deleted

## 2020-11-15 ENCOUNTER — Encounter: Payer: Self-pay | Admitting: Physician Assistant

## 2020-11-15 ENCOUNTER — Ambulatory Visit: Payer: Self-pay | Admitting: *Deleted

## 2020-11-15 ENCOUNTER — Ambulatory Visit (INDEPENDENT_AMBULATORY_CARE_PROVIDER_SITE_OTHER): Payer: 59 | Admitting: Physician Assistant

## 2020-11-15 VITALS — BP 148/109 | HR 86 | Temp 98.4°F | Ht 70.0 in | Wt 185.4 lb

## 2020-11-15 DIAGNOSIS — R030 Elevated blood-pressure reading, without diagnosis of hypertension: Secondary | ICD-10-CM

## 2020-11-15 DIAGNOSIS — F1010 Alcohol abuse, uncomplicated: Secondary | ICD-10-CM

## 2020-11-15 MED ORDER — AMLODIPINE BESYLATE 5 MG PO TABS: 5.0000 mg | ORAL_TABLET | Freq: Every day | ORAL | 0 refills | Status: DC

## 2020-11-15 NOTE — Progress Notes (Signed)
Established patient visit   Patient: Kyle Graves   DOB: [birth year]   25 y.o. Male  MRN: [id number] Visit Date: 11/15/2020  Today's healthcare provider: Trinna Post, PA-C   Chief Complaint  Patient presents with  . Headache  . Dizziness  I,Xzaviar Maloof M Chevette Fee,acting as a scribe for Trinna Post, PA-C.,have documented all relevant documentation on the behalf of Trinna Post, PA-C,as directed by  Trinna Post, PA-C while in the presence of Trinna Post, PA-C.  Subjective    Dizziness This is a new problem. The current episode started yesterday. The problem occurs rarely. The problem has been gradually improving. Associated symptoms include headaches and numbness. Pertinent negatives include no chest pain, fatigue, vertigo, visual change or weakness. The symptoms are aggravated by standing. He has tried nothing for the symptoms. The treatment provided no relief.  Patient reports elevated blood pressure readings for a few days now. He reports family history of hypertension.    Drink 3 sixteen ounces of MXD nightly. Has drank less over the past day by drinking only two drinks a night. Denies using drugs.   Orthostatic VS for the past 72 hrs (Last 3 readings):  Orthostatic BP Patient Position BP Location Cuff Size Orthostatic Pulse  11/15/20 0929 (!) 143/108 Standing Left Arm Normal 90  11/15/20 0926 (!) 142/101 Sitting Left Arm Normal 90  11/15/20 0922 (!) 145/97 Supine Left Arm Normal 86       Medications: Outpatient Medications Prior to Visit  Medication Sig  . famotidine (PEPCID) 20 MG tablet Take 1 tablet (20 mg total) by mouth 2 (two) times daily. (Patient not taking: No sig reported)  . hydrocortisone (ANUSOL-HC) 25 MG suppository Place 1 suppository (25 mg total) rectally at bedtime as needed for hemorrhoids. (Patient not taking: Reported on 11/15/2020)   No facility-administered medications prior to visit.    Review of Systems   Constitutional: Negative for fatigue.  Respiratory: Negative for chest tightness, shortness of breath and wheezing.   Cardiovascular: Negative for chest pain, palpitations and leg swelling.  Neurological: Positive for dizziness, numbness and headaches. Negative for vertigo and weakness.       Objective    BP (!) 148/109 (BP Location: Left Arm, Patient Position: Sitting, Cuff Size: Normal)   Pulse 86   Temp 98.4 F (36.9 C) (Oral)   Ht _0  (1.778 m)   Wt 185 lb 6.4 oz (84.1 kg)   SpO2 100%   BMI 26.60 kg/m     Physical Exam Constitutional:      Appearance: Normal appearance.  Cardiovascular:     Rate and Rhythm: Normal rate and regular rhythm.     Heart sounds: Normal heart sounds.  Pulmonary:     Effort: Pulmonary effort is normal.     Breath sounds: Normal breath sounds.  Skin:    General: Skin is warm and dry.  Neurological:     Mental Status: He is alert and oriented to person, place, and time. Mental status is at baseline.  Psychiatric:        Mood and Affect: Mood normal.        Behavior: Behavior normal.       Results for orders placed or performed in visit on 11/15/20  Comprehensive Metabolic Panel (CMET)  Result Value Ref Range   Glucose 128 (H) 65 - 99 mg/dL   BUN 10 6 - 20 mg/dL   Creatinine, Ser 0.90 0.76 - 1.27 mg/dL  eGFR 122 >59 mL/min/1.73   BUN/Creatinine Ratio 11 9 - 20   Sodium 141 134 - 144 mmol/L   Potassium 4.0 3.5 - 5.2 mmol/L   Chloride 98 96 - 106 mmol/L   CO2 23 20 - 29 mmol/L   Calcium 10.0 8.7 - 10.2 mg/dL   Total Protein 7.8 6.0 - 8.5 g/dL   Albumin 5.3 (H) 4.1 - 5.2 g/dL   Globulin, Total 2.5 1.5 - 4.5 g/dL   Albumin/Globulin Ratio 2.1 1.2 - 2.2   Bilirubin Total 0.7 0.0 - 1.2 mg/dL   Alkaline Phosphatase 50 44 - 121 IU/L   AST 50 (H) 0 - 40 IU/L   ALT 87 (H) 0 - 44 IU/L    Assessment & Plan    1. Elevated blood pressure reading  Recheck at home and start amlodipine if still elevated. Liver enzymes slightly elevated  but likely related to alcohol abuse.   - amLODipine (NORVASC) 5 MG tablet; Take 1 tablet (5 mg total) by mouth daily.  Dispense: 90 tablet; Refill: 0 - Comprehensive Metabolic Panel (CMET)  2. Alcohol abuse  - AMB Referral to [place]   Return in about 2 weeks (around 11/29/2020).      ITrinna Post, PA-C, have reviewed all documentation for this visit. The documentation on 11/16/20 for the exam, diagnosis, procedures, and orders are all accurate and complete.  The entirety of the information documented in the History of Present Illness, Review of Systems and Physical Exam were personally obtained by me. Portions of this information were initially documented by [facility] and reviewed by me for thoroughness and accuracy.     Paulene Floor  [facility] [phone] (phone) [phone] (fax)  [place]

## 2020-11-15 NOTE — Chronic Care Management (AMB) (Signed)
  Care Management   Outreach Note  11/15/2020 Name: [name] MRN: [id number] DOB: April 26, 1996  Kyle Graves is a 25 y.o. year old male who is a primary care patient of Maple Hudson., MD. I reached out to Kyle Graves by phone today in response to a referral sent by Mr. Kyle Graves's PCP, Dr. Sullivan Lone.     A telephone outreach was attempted today patient was not available. The patient was referred to the case management team for assistance with care management and care coordination.   Follow Up Plan: The care management team will reach out to the patient again over the next 3-5 days.  If patient returns call to provider office, please advise to call Embedded Care Management Care Guide Gwenevere Ghazi at [phone].  Gwenevere Ghazi  Care Guide, Embedded Care Coordination [facility] Management

## 2020-11-15 NOTE — Patient Instructions (Signed)

## 2020-11-15 NOTE — Telephone Encounter (Signed)
Agent called me to let me know this pt's mother just called in for her son.  She is not with him.   He is in his apartment.  Mother no longer on line.  She mentioned his BP was 127/110 and he is having chest pain.   He is a pt of Dr. Sullivan Lone at [facility].   LOV 03/2019.      I let agent, Olegario Messier know I would call him.  I called pt.   I let him know his mother just called into the office saying he was having chest pain and high BP.   "No, no".   "I'm not having chest pain my BP is up".  He denied having chest pain when I asked him again.   I changed protocols to elevated BP from chest pain.  His BP has been elevated for a few days.  127/110 over the weekend.  See readings in notes below.   He does not have a BP cuff his mother does.   Mother checked his BP at her house.   I asked what was the reason for checking his BP does he have a history of hypertension or is on medication?  He said he did not have a history of high BP and was not on medication for it.  "She just checked it for me".  He did mention he had a headache yesterday and a very brief dizzy spell yesterday.   He feels fine this morning.     Over the weekend he checked his blood sugar and it was fine.   I asked if he has diabetes and he said,  "No".   "My mother has a glucose machine and checked it for me".  Covid questionnaire completed.  I made him an in office visit with Osvaldo Angst, PA-C  for this morning at 9:20 in office.   I instructed him to arrive by 9:00 to have his records updated since it's been a while since he has been in.   He mentioned he usually sees Adriana not Dr. Sullivan Lone.  I sent my notes to [facility] for [employer].     Reason for Disposition . Systolic BP  >= 160 OR Diastolic >= 100  Answer Assessment - Initial Assessment Questions 1. LOCATION: "Where does it hurt?"       I called pt.  He denies having chest pain.  148/109 last night.  I don't have a BP cuff my mom  checked it at her house last night.     2. RADIATION: "Does the pain go anywhere else?" (e.g., into neck, jaw, arms, back)     *No Answer* 3. ONSET: "When did the chest pain begin?" (Minutes, hours or days)      *No Answer* 4. PATTERN "Does the pain come and go, or has it been constant since it started?"  "Does it get worse with exertion?"      *No Answer* 5. DURATION: "How long does it last" (e.g., seconds, minutes, hours)     *No Answer* 6. SEVERITY: "How bad is the pain?"  (e.g., Scale 1-10; mild, moderate, or severe)    - MILD (1-3): doesn't interfere with normal activities     - MODERATE (4-7): interferes with normal activities or awakens from sleep    - SEVERE (8-10): excruciating pain, unable to do any normal activities       *No Answer* 7. CARDIAC RISK FACTORS: "Do you have any history of heart problems  or risk factors for heart disease?" (e.g., angina, prior heart attack; diabetes, high blood pressure, high cholesterol, smoker, or strong family history of heart disease)     *No Answer* 8. PULMONARY RISK FACTORS: "Do you have any history of lung disease?"  (e.g., blood clots in lung, asthma, emphysema, birth control pills)     *No Answer* 9. CAUSE: "What do you think is causing the chest pain?"     *No Answer* 10. OTHER SYMPTOMS: "Do you have any other symptoms?" (e.g., dizziness, nausea, vomiting, sweating, fever, difficulty breathing, cough)       *No Answer* 11. PREGNANCY: "Is there any chance you are pregnant?" "When was your last menstrual period?"       *No Answer*  Answer Assessment - Initial Assessment Questions 1. BLOOD PRESSURE: "What is the blood pressure?" "Did you take at least two measurements 5 minutes apart?"     BP 127/110, last night 148/109.  His mother is checking his BP at her house where she has a cuff.   He does not have one.  He said he checked his sugar on Sunday and it was fine.   Denies being diabetic.   2. ONSET: "When did you take your blood  pressure?"     Mother took it at her house 3. HOW: "How did you obtain the blood pressure?" (e.g., visiting nurse, automatic home BP monitor)     Mother's cuff 4. HISTORY: "Do you have a history of high blood pressure?"     No 5. MEDICATIONS: "Are you taking any medications for blood pressure?" "Have you missed any doses recently?"     No 6. OTHER SYMPTOMS: "Do you have any symptoms?" (e.g., headache, chest pain, blurred vision, difficulty breathing, weakness)     Denies headaches, blurry vision. Having numbness in both hands intermittently.   I feel like I have anxiety.   It happens when I'm anxious.  No history of anxiety but over past year it has been a problem. 7. PREGNANCY: "Is there any chance you are pregnant?" "When was your last menstrual period?"     N/A  Protocols used: BLOOD PRESSURE - HIGH-A-AH, CHEST PAIN-A-AH

## 2020-11-16 LAB — COMPREHENSIVE METABOLIC PANEL
ALT: 87 IU/L — ABNORMAL HIGH (ref 0–44)
AST: 50 IU/L — ABNORMAL HIGH (ref 0–40)
Albumin/Globulin Ratio: 2.1 (ref 1.2–2.2)
Albumin: 5.3 g/dL — ABNORMAL HIGH (ref 4.1–5.2)
Alkaline Phosphatase: 50 IU/L (ref 44–121)
BUN/Creatinine Ratio: 11 (ref 9–20)
BUN: 10 mg/dL (ref 6–20)
Bilirubin Total: 0.7 mg/dL (ref 0.0–1.2)
CO2: 23 mmol/L (ref 20–29)
Calcium: 10 mg/dL (ref 8.7–10.2)
Chloride: 98 mmol/L (ref 96–106)
Creatinine, Ser: 0.9 mg/dL (ref 0.76–1.27)
Globulin, Total: 2.5 g/dL (ref 1.5–4.5)
Glucose: 128 mg/dL — ABNORMAL HIGH (ref 65–99)
Potassium: 4 mmol/L (ref 3.5–5.2)
Sodium: 141 mmol/L (ref 134–144)
Total Protein: 7.8 g/dL (ref 6.0–8.5)
eGFR: 122 mL/min/{1.73_m2} (ref >59)

## 2020-11-22 ENCOUNTER — Telehealth: Payer: Self-pay

## 2020-11-22 NOTE — Telephone Encounter (Signed)
-----   Message from Trey Sailors, [place] sent at 11/18/2020  4:47 PM EST ----- Can we let patient know liver enzymes are elevated which is likely related to alcohol. Remaining labwork normal.

## 2020-11-22 NOTE — Telephone Encounter (Signed)
I called pt and pt verbalized understanding of information below.  

## 2020-11-24 NOTE — Chronic Care Management (AMB) (Signed)
   Care Management   Outreach Note  11/24/2020 Name: Kyle Graves MRN: [id number] DOB: February 21, 1996  Kyle Graves is a 25 y.o. year old male who is a primary care patient of Maple Hudson., MD. I reached out to Derrien N Donati by phone today in response to a referral sent by Mr. Broden Holt Faron's PCP, Dr. Sullivan Lone.     A second unsuccessful telephone outreach was attempted today. The patient was referred to the case management team for assistance with care management and care coordination.   Follow Up Plan: A HIPAA compliant phone message was left for the patient providing contact information and requesting a return call. The care management team will reach out to the patient again over the next 7 days. If patient returns call to provider office, please advise to call Embedded Care Management Care Guide Gwenevere Ghazi at [phone].  Gwenevere Ghazi  Care Guide, Embedded Care Coordination [facility] Management

## 2020-11-30 NOTE — Chronic Care Management (AMB) (Signed)
  Care Management   Outreach Note  11/30/2020 Name: Kyle Graves MRN: [id number] DOB: 1996-02-23  Referred by: Maple Hudson., MD Reason for referral : Care Coordination (Outreach to schedule referral with Licensed Clinical Social Worker)   Third unsuccessful telephone outreach was attempted today. The patient was referred to the case management team for assistance with care management and care coordination. The patient's primary care provider has been notified of our unsuccessful attempts to make or maintain contact with the patient. The care management team is pleased to engage with this patient at any time in the future should he/she be interested in assistance from the care management team.   Follow Up Plan: We have been unable to make contact with the patient. The care management team is available to follow up with the patient after provider conversation with the patient regarding recommendation for care management engagement and subsequent re-referral to the care management team.   [facility] Guide, Embedded Care Coordination [facility]  Care Management

## 2020-12-01 ENCOUNTER — Other Ambulatory Visit: Payer: Self-pay

## 2020-12-01 ENCOUNTER — Encounter: Payer: Self-pay | Admitting: Family Medicine

## 2020-12-01 ENCOUNTER — Ambulatory Visit (INDEPENDENT_AMBULATORY_CARE_PROVIDER_SITE_OTHER): Payer: 59 | Admitting: Family Medicine

## 2020-12-01 VITALS — BP 127/87 | HR 79 | Resp 16 | Ht 70.0 in | Wt 180.0 lb

## 2020-12-01 DIAGNOSIS — I10 Essential (primary) hypertension: Secondary | ICD-10-CM

## 2020-12-01 DIAGNOSIS — R739 Hyperglycemia, unspecified: Secondary | ICD-10-CM

## 2020-12-01 DIAGNOSIS — E663 Overweight: Secondary | ICD-10-CM | POA: Diagnosis not present

## 2020-12-01 DIAGNOSIS — R7989 Other specified abnormal findings of blood chemistry: Secondary | ICD-10-CM | POA: Insufficient documentation

## 2020-12-01 LAB — POCT GLYCOSYLATED HEMOGLOBIN (HGB A1C): Hemoglobin A1C: 5.4 % (ref 4.0–5.6)

## 2020-12-01 NOTE — Progress Notes (Deleted)
     Established patient visit   Patient: Kyle Graves   DOB: 1996-05-24   25 y.o. Male  MRN: [id number] Visit Date: 12/01/2020  Today's healthcare provider: Shirlee Latch, MD   Chief Complaint  Patient presents with  . Hypertension   Subjective    HPI  Follow up for hypertension  The patient was last seen for this 2 weeks ago. Changes made at last visit include start amlodipine 5mg  daily.  He reports {excellent/good/fair/poor:19665} compliance with treatment. He feels that condition is {improved/worse/unchanged:3041574}. He {is/is not:21021397} having side effects. ***  -----------------------------------------------------------------------------------------  Patient Active Problem List   Diagnosis Date Noted  . History of cocaine abuse (HCC) 07/19/2017  . Alcohol abuse 07/19/2017  . Chest pain 07/18/2017  . Left arm numbness 07/18/2017  . Vomiting 07/18/2017   Social History   Tobacco Use  . Smoking status: Former Smoker    Packs/day: 0.25    Years: 3.00    Pack years: 0.75    Quit date: 06/10/2018    Years since quitting: 2.4  . Smokeless tobacco: Never Used  Vaping Use  . Vaping Use: Never used  Substance Use Topics  . Alcohol use: Yes    Comment: everyday, beer and liquor average 8 beers a day  . Drug use: No   No Known Allergies     Medications: Outpatient Medications Prior to Visit  Medication Sig  . amLODipine (NORVASC) 5 MG tablet Take 1 tablet (5 mg total) by mouth daily.  . famotidine (PEPCID) 20 MG tablet Take 1 tablet (20 mg total) by mouth 2 (two) times daily. (Patient not taking: No sig reported)  . hydrocortisone (ANUSOL-HC) 25 MG suppository Place 1 suppository (25 mg total) rectally at bedtime as needed for hemorrhoids. (Patient not taking: Reported on 11/15/2020)   No facility-administered medications prior to visit.    Review of Systems  Last CBC Lab Results  Component Value Date   WBC 5.4 11/22/2017   HGB 15.8  11/22/2017   HCT 44.8 11/22/2017   MCV 87.1 11/22/2017   MCH 30.8 11/22/2017   RDW 13.8 11/22/2017   PLT 200 11/22/2017   Last metabolic panel Lab Results  Component Value Date   GLUCOSE 128 (H) 11/15/2020   NA 141 11/15/2020   K 4.0 11/15/2020   CL 98 11/15/2020   CO2 23 11/15/2020   BUN 10 11/15/2020   CREATININE 0.90 11/15/2020   GFRNONAA >60 11/22/2017   GFRAA >60 11/22/2017   CALCIUM 10.0 11/15/2020   PROT 7.8 11/15/2020   ALBUMIN 5.3 (H) 11/15/2020   LABGLOB 2.5 11/15/2020   AGRATIO 2.1 11/15/2020   BILITOT 0.7 11/15/2020   ALKPHOS 50 11/15/2020   AST 50 (H) 11/15/2020   ALT 87 (H) 11/15/2020   ANIONGAP 10 11/22/2017       Objective    There were no vitals taken for this visit. BP Readings from Last 3 Encounters:  11/15/20 (!) 148/109  04/02/19 118/64  08/21/18 113/80   Wt Readings from Last 3 Encounters:  12/01/20 180 lb (81.6 kg)  11/15/20 185 lb 6.4 oz (84.1 kg)  04/02/19 175 lb (79.4 kg)      Physical Exam  ***  No results found for any visits on 12/01/20.  Assessment & Plan     ***  No follow-ups on file.      {provider attestation***:1}   12/03/20, MD  [facility] [phone] (phone) [phone] (fax)  [facility]

## 2020-12-01 NOTE — Patient Instructions (Signed)
Bring BP cuff with you to next appointment  Continue amlodipine 5mg  daily   DASH Eating Plan DASH stands for Dietary Approaches to Stop Hypertension. The DASH eating plan is a healthy eating plan that has been shown to:  Reduce high blood pressure (hypertension).  Reduce your risk for type 2 diabetes, heart disease, and stroke.  Help with weight loss. What are tips for following this plan? Reading food labels  Check food labels for the amount of salt (sodium) per serving. Choose foods with less than 5 percent of the Daily Value of sodium. Generally, foods with less than 300 milligrams (mg) of sodium per serving fit into this eating plan.  To find whole grains, look for the word "whole" as the first word in the ingredient list. Shopping  Buy products labeled as "low-sodium" or "no salt added."  Buy fresh foods. Avoid canned foods and pre-made or frozen meals. Cooking  Avoid adding salt when cooking. Use salt-free seasonings or herbs instead of table salt or sea salt. Check with your health care provider or pharmacist before using salt substitutes.  Do not fry foods. Cook foods using healthy methods such as baking, boiling, grilling, roasting, and broiling instead.  Cook with heart-healthy oils, such as olive, canola, avocado, soybean, or sunflower oil. Meal planning  Eat a balanced diet that includes: ? 4 or more servings of fruits and 4 or more servings of vegetables each day. Try to fill one-half of your plate with fruits and vegetables. ? 6-8 servings of whole grains each day. ? Less than 6 oz (170 g) of lean meat, poultry, or fish each day. A 3-oz (85-g) serving of meat is about the same size as a deck of cards. One egg equals 1 oz (28 g). ? 2-3 servings of low-fat dairy each day. One serving is 1 cup (237 mL). ? 1 serving of nuts, seeds, or beans 5 times each week. ? 2-3 servings of heart-healthy fats. Healthy fats called omega-3 fatty acids are found in foods such as  walnuts, flaxseeds, fortified milks, and eggs. These fats are also found in cold-water fish, such as sardines, salmon, and mackerel.  Limit how much you eat of: ? Canned or prepackaged foods. ? Food that is high in trans fat, such as some fried foods. ? Food that is high in saturated fat, such as fatty meat. ? Desserts and other sweets, sugary drinks, and other foods with added sugar. ? Full-fat dairy products.  Do not salt foods before eating.  Do not eat more than 4 egg yolks a week.  Try to eat at least 2 vegetarian meals a week.  Eat more home-cooked food and less restaurant, buffet, and fast food.   Lifestyle  When eating at a restaurant, ask that your food be prepared with less salt or no salt, if possible.  If you drink alcohol: ? Limit how much you use to:  0-1 drink a day for women who are not pregnant.  0-2 drinks a day for men. ? Be aware of how much alcohol is in your drink. In the [place] one drink equals one 12 oz bottle of beer (355 mL), one 5 oz glass of wine (148 mL), or one 1 oz glass of hard liquor (44 mL). General information  Avoid eating more than 2,300 mg of salt a day. If you have hypertension, you may need to reduce your sodium intake to 1,500 mg a day.  Work with your health care provider to maintain a healthy  body weight or to lose weight. Ask what an ideal weight is for you.  Get at least 30 minutes of exercise that causes your heart to beat faster (aerobic exercise) most days of the week. Activities may include walking, swimming, or biking.  Work with your health care provider or dietitian to adjust your eating plan to your individual calorie needs. What foods should I eat? Fruits All fresh, dried, or frozen fruit. Canned fruit in natural juice (without added sugar). Vegetables Fresh or frozen vegetables (raw, steamed, roasted, or grilled). Low-sodium or reduced-sodium tomato and vegetable juice. Low-sodium or reduced-sodium tomato sauce and tomato  paste. Low-sodium or reduced-sodium canned vegetables. Grains Whole-grain or whole-wheat bread. Whole-grain or whole-wheat pasta. Brown rice. [place]. Bulgur. Whole-grain and low-sodium cereals. Pita bread. Low-fat, low-sodium crackers. Whole-wheat flour tortillas. Meats and other proteins Skinless chicken or [place]. Ground chicken or [place]. Pork with fat trimmed off. Fish and seafood. Egg whites. Dried beans, peas, or lentils. Unsalted nuts, nut butters, and seeds. Unsalted canned beans. Lean cuts of beef with fat trimmed off. Low-sodium, lean precooked or cured meat, such as sausages or meat loaves. Dairy Low-fat (1%) or fat-free (skim) milk. Reduced-fat, low-fat, or fat-free cheeses. Nonfat, low-sodium ricotta or cottage cheese. Low-fat or nonfat yogurt. Low-fat, low-sodium cheese. Fats and oils Soft margarine without trans fats. Vegetable oil. Reduced-fat, low-fat, or light mayonnaise and salad dressings (reduced-sodium). Canola, safflower, olive, avocado, soybean, and sunflower oils. Avocado. Seasonings and condiments Herbs. Spices. Seasoning mixes without salt. Other foods Unsalted popcorn and pretzels. Fat-free sweets. The items listed above may not be a complete list of foods and beverages you can eat. Contact a dietitian for more information. What foods should I avoid? Fruits Canned fruit in a light or heavy syrup. Fried fruit. Fruit in cream or butter sauce. Vegetables Creamed or fried vegetables. Vegetables in a cheese sauce. Regular canned vegetables (not low-sodium or reduced-sodium). Regular canned tomato sauce and paste (not low-sodium or reduced-sodium). Regular tomato and vegetable juice (not low-sodium or reduced-sodium). Angie Fava. Olives. Grains Baked goods made with fat, such as croissants, muffins, or some breads. Dry pasta or rice meal packs. Meats and other proteins Fatty cuts of meat. Ribs. Fried meat. Berniece Salines. Bologna, salami, and other precooked or cured meats,  such as sausages or meat loaves. Fat from the back of a pig (fatback). Bratwurst. Salted nuts and seeds. Canned beans with added salt. Canned or smoked fish. Whole eggs or egg yolks. Chicken or [place] with skin. Dairy Whole or 2% milk, cream, and half-and-half. Whole or full-fat cream cheese. Whole-fat or sweetened yogurt. Full-fat cheese. Nondairy creamers. Whipped toppings. Processed cheese and cheese spreads. Fats and oils Butter. Stick margarine. Lard. Shortening. Ghee. Bacon fat. Tropical oils, such as coconut, palm kernel, or palm oil. Seasonings and condiments Onion salt, garlic salt, seasoned salt, table salt, and sea salt. Worcestershire sauce. Tartar sauce. Barbecue sauce. Teriyaki sauce. Soy sauce, including reduced-sodium. Steak sauce. Canned and packaged gravies. Fish sauce. Oyster sauce. Cocktail sauce. Store-bought horseradish. Ketchup. Mustard. Meat flavorings and tenderizers. Bouillon cubes. Hot sauces. Pre-made or packaged marinades. Pre-made or packaged taco seasonings. Relishes. Regular salad dressings. Other foods Salted popcorn and pretzels. The items listed above may not be a complete list of foods and beverages you should avoid. Contact a dietitian for more information. Where to find more information  National Heart, Lung, and Blood Institute: https://wilson-eaton.com/  American Heart Association: www.heart.org  Academy of Nutrition and Dietetics: www.eatright.[place]: www.kidney.org Summary  The  DASH eating plan is a healthy eating plan that has been shown to reduce high blood pressure (hypertension). It may also reduce your risk for type 2 diabetes, heart disease, and stroke.  When on the DASH eating plan, aim to eat more fresh fruits and vegetables, whole grains, lean proteins, low-fat dairy, and heart-healthy fats.  With the DASH eating plan, you should limit salt (sodium) intake to 2,300 mg a day. If you have hypertension, you may need to reduce  your sodium intake to 1,500 mg a day.  Work with your health care provider or dietitian to adjust your eating plan to your individual calorie needs. This information is not intended to replace advice given to you by your health care provider. Make sure you discuss any questions you have with your health care provider. Document Revised: 07/31/2019 Document Reviewed: 07/31/2019 Elsevier Patient Education  2021 [employer].

## 2020-12-01 NOTE — Assessment & Plan Note (Signed)
Discussed importance of healthy weight management Discussed diet and exercise  

## 2020-12-01 NOTE — Assessment & Plan Note (Signed)
Noted incidentally on last labs Avoid alcohol and Tylenol Recheck in 1 month

## 2020-12-01 NOTE — Progress Notes (Signed)
Established patient visit   Patient: Kyle Graves   DOB: 02-06-1996   25 y.o. Male  MRN: [id number] Visit Date: 12/01/2020  Today's healthcare provider: Shirlee Latch, MD   Chief Complaint  Patient presents with  . Hypertension   Subjective    HPI  Hypertension, follow-up  BP Readings from Last 3 Encounters:  12/01/20 127/87  11/15/20 (!) 148/109  04/02/19 118/64   Wt Readings from Last 3 Encounters:  12/01/20 180 lb (81.6 kg)  11/15/20 185 lb 6.4 oz (84.1 kg)  04/02/19 175 lb (79.4 kg)     He was last seen for hypertension 2 weeks ago.  BP at that visit was 148/109. Management since that visit includes starting on amlodipine 5mg  daily.  He reports good compliance with treatment. He is not having side effects.  He is following a Regular diet. He is exercising. He does not smoke.  Use of agents associated with hypertension: none.   Outside blood pressures are checked daily. Patient reports that it averages 130s/90s.  Symptoms: No chest pain No chest pressure  No palpitations No syncope  No dyspnea No orthopnea  No paroxysmal nocturnal dyspnea No lower extremity edema   Pertinent labs: No results found for: CHOL, HDL, LDLCALC, LDLDIRECT, TRIG, CHOLHDL Lab Results  Component Value Date   NA 141 11/15/2020   K 4.0 11/15/2020   CREATININE 0.90 11/15/2020   GFRNONAA >60 11/22/2017   GFRAA >60 11/22/2017   GLUCOSE 128 (H) 11/15/2020     The ASCVD Risk score 01/15/2021 DC Jr., et al., 2013) failed to calculate for the following reasons:   The 2013 ASCVD risk score is only valid for ages 74 to 66    Social History   Tobacco Use  . Smoking status: Former Smoker    Packs/day: 0.25    Years: 3.00    Pack years: 0.75    Quit date: 06/10/2018    Years since quitting: 2.4  . Smokeless tobacco: Never Used  Vaping Use  . Vaping Use: Never used  Substance Use Topics  . Alcohol use: Yes    Comment: everyday, beer and liquor average 8 beers a day   . Drug use: No       Medications: Outpatient Medications Prior to Visit  Medication Sig  . amLODipine (NORVASC) 5 MG tablet Take 1 tablet (5 mg total) by mouth daily.  . [DISCONTINUED] famotidine (PEPCID) 20 MG tablet Take 1 tablet (20 mg total) by mouth 2 (two) times daily. (Patient not taking: No sig reported)  . [DISCONTINUED] hydrocortisone (ANUSOL-HC) 25 MG suppository Place 1 suppository (25 mg total) rectally at bedtime as needed for hemorrhoids. (Patient not taking: No sig reported)   No facility-administered medications prior to visit.    Review of Systems  Constitutional: Negative for activity change.  Respiratory: Negative for cough and shortness of breath.   Cardiovascular: Negative for chest pain, palpitations and leg swelling.  Musculoskeletal: Negative for arthralgias, gait problem and joint swelling.  Neurological: Negative for dizziness, light-headedness and headaches.       Objective    BP 127/87   Pulse 79   Resp 16   Ht 5\' 10"  (1.778 m)   Wt 180 lb (81.6 kg)   BMI 25.83 kg/m     Physical Exam Vitals reviewed.  Constitutional:      General: He is not in acute distress.    Appearance: Normal appearance. He is not diaphoretic.  HENT:  Head: Normocephalic and atraumatic.  Eyes:     General: No scleral icterus.    Conjunctiva/sclera: Conjunctivae normal.  Cardiovascular:     Rate and Rhythm: Normal rate and regular rhythm.     Pulses: Normal pulses.     Heart sounds: Normal heart sounds. No murmur heard.   Pulmonary:     Effort: Pulmonary effort is normal. No respiratory distress.     Breath sounds: Normal breath sounds. No wheezing or rhonchi.  Abdominal:     General: There is no distension.     Palpations: Abdomen is soft.     Tenderness: There is no abdominal tenderness.  Musculoskeletal:     Cervical back: Neck supple.     Right lower leg: No edema.     Left lower leg: No edema.  Lymphadenopathy:     Cervical: No cervical  adenopathy.  Skin:    General: Skin is warm and dry.     Capillary Refill: Capillary refill takes less than 2 seconds.     Findings: No rash.  Neurological:     Mental Status: He is alert and oriented to person, place, and time.     Cranial Nerves: No cranial nerve deficit.  Psychiatric:        Mood and Affect: Mood normal.        Behavior: Behavior normal.       Results for orders placed or performed in visit on 12/01/20  POCT glycosylated hemoglobin (Hb A1C)  Result Value Ref Range   Hemoglobin A1C 5.4 4.0 - 5.6 %   HbA1c POC (<> result, manual entry)     HbA1c, POC (prediabetic range)     HbA1c, POC (controlled diabetic range)      Assessment & Plan     Problem List Items Addressed This Visit      Cardiovascular and Mediastinum   Primary hypertension - Primary    Recent diagnosis Tolerating amlodipine well Initially elevated, but well controlled on recheck and on home readings Continue to monitor home blood pressures No changes to medications        Other   Hyperglycemia    Noted on last labs A1c obtained today that showed normal glycemic control Continue to monitor      Relevant Orders   POCT glycosylated hemoglobin (Hb A1C) (Completed)   Overweight    Discussed importance of healthy weight management Discussed diet and exercise       Elevated LFTs    Noted incidentally on last labs Avoid alcohol and Tylenol Recheck in 1 month          Return in about 4 weeks (around 12/29/2020) for chronic disease f/u.      I, Shirlee Latch, MD, have reviewed all documentation for this visit. The documentation on 12/01/20 for the exam, diagnosis, procedures, and orders are all accurate and complete.   Bacigalupo, Marzella Schlein, MD, MPH [facility]

## 2020-12-01 NOTE — Assessment & Plan Note (Signed)
Recent diagnosis Tolerating amlodipine well Initially elevated, but well controlled on recheck and on home readings Continue to monitor home blood pressures No changes to medications

## 2020-12-01 NOTE — Assessment & Plan Note (Signed)
Noted on last labs A1c obtained today that showed normal glycemic control Continue to monitor

## 2021-02-02 ENCOUNTER — Telehealth: Payer: Self-pay | Admitting: *Deleted

## 2021-02-02 NOTE — Telephone Encounter (Signed)
Please advise 

## 2021-02-02 NOTE — Telephone Encounter (Signed)
Copied from CRM [id number]. Topic: General - Inquiry >> Feb 02, 2021  2:27 PM Daphine Deutscher D wrote: Reason for CRM: Pt called saying he saying poison oak or poison ivy on his body and want to know if something can be sent to the pharmacy.  [facility]  [address]  CB#  [phone]

## 2021-02-13 ENCOUNTER — Other Ambulatory Visit: Payer: Self-pay | Admitting: Family Medicine

## 2021-02-13 DIAGNOSIS — R030 Elevated blood-pressure reading, without diagnosis of hypertension: Secondary | ICD-10-CM

## 2021-02-13 MED ORDER — AMLODIPINE BESYLATE 5 MG PO TABS: 5.0000 mg | ORAL_TABLET | Freq: Every day | ORAL | 0 refills | Status: DC

## 2021-02-13 NOTE — Telephone Encounter (Signed)
Medication Refill - Medication: amLODipine (NORVASC) 5 MG tablet  Pt asked if this can be sent today / he has not taken med today and asked if he can receive a call when sent   Has the patient contacted their pharmacy? Yes.   (Agent: If no, request that the patient contact the pharmacy for the refill.) (Agent: If yes, when and what did the pharmacy advise?)need pcp approval   Preferred Pharmacy (with phone number or street name): [id number] [place], [place] - [address]  [address] [place], [place] [address]  Phone:  [phone] Fax:  [phone]   Agent: Please be advised that RX refills may take up to 3 business days. We ask that you follow-up with your pharmacy.

## 2021-03-01 ENCOUNTER — Encounter: Payer: Self-pay | Admitting: Family Medicine

## 2021-03-01 ENCOUNTER — Ambulatory Visit (INDEPENDENT_AMBULATORY_CARE_PROVIDER_SITE_OTHER): Payer: 59 | Admitting: Family Medicine

## 2021-03-01 ENCOUNTER — Other Ambulatory Visit: Payer: Self-pay

## 2021-03-01 VITALS — BP 129/86 | HR 89 | Temp 98.6°F | Resp 16 | Ht 70.0 in | Wt 180.0 lb

## 2021-03-01 DIAGNOSIS — I10 Essential (primary) hypertension: Secondary | ICD-10-CM | POA: Diagnosis not present

## 2021-03-01 NOTE — Progress Notes (Signed)
Established patient visit   Patient: Kyle Graves   DOB: March 29, 1996   24 y.o. Male  MRN: [id number] Visit Date: 03/01/2021  Today's healthcare provider: Megan Mans, MD   No chief complaint on file.  Subjective    HPI  Pt in for follow-up.  He feels well. Hypertension, follow-up  BP Readings from Last 3 Encounters:  03/01/21 129/86  12/01/20 127/87  11/15/20 (!) 148/109   Wt Readings from Last 3 Encounters:  03/01/21 180 lb (81.6 kg)  12/01/20 180 lb (81.6 kg)  11/15/20 185 lb 6.4 oz (84.1 kg)     He was last seen for hypertension 3 months ago.  BP at that visit was 127/87. Management since that visit includes; on amlodipine. He reports good compliance with treatment. He is not having side effects.  He is exercising. He is not adherent to low salt diet.   Outside blood pressures are checked occasionally.  He does not smoke.  Use of agents associated with hypertension: none.        Medications: Outpatient Medications Prior to Visit  Medication Sig   amLODipine (NORVASC) 5 MG tablet Take 1 tablet (5 mg total) by mouth daily.   No facility-administered medications prior to visit.    Review of Systems  Constitutional:  Negative for appetite change, chills and fever.  Respiratory:  Negative for chest tightness, shortness of breath and wheezing.   Cardiovascular:  Negative for chest pain and palpitations.  Gastrointestinal:  Negative for abdominal pain, nausea and vomiting.       Objective    BP 129/86   Pulse 89   Temp 98.6 F (37 C)   Resp 16   Ht 5\' 10"  (1.778 m)   Wt 180 lb (81.6 kg)   BMI 25.83 kg/m  BP Readings from Last 3 Encounters:  03/01/21 129/86  12/01/20 127/87  11/15/20 (!) 148/109   Wt Readings from Last 3 Encounters:  03/01/21 180 lb (81.6 kg)  12/01/20 180 lb (81.6 kg)  11/15/20 185 lb 6.4 oz (84.1 kg)       Physical Exam Vitals reviewed.  Constitutional:      Appearance: Normal appearance.  HENT:      Head: Normocephalic and atraumatic.     Right Ear: External ear normal.     Left Ear: External ear normal.  Eyes:     General: No scleral icterus.    Conjunctiva/sclera: Conjunctivae normal.  Cardiovascular:     Heart sounds: Normal heart sounds.  Pulmonary:     Breath sounds: Normal breath sounds.  Abdominal:     Palpations: Abdomen is soft.  Neurological:     Mental Status: He is alert and oriented to person, place, and time.  Psychiatric:        Mood and Affect: Mood normal.        Behavior: Behavior normal.        Thought Content: Thought content normal.        Judgment: Judgment normal.      No results found for any visits on 03/01/21.  Assessment & Plan     1. Primary hypertension Good control on amlodipine.   No follow-ups on file.      I, 03/03/21, MD, have reviewed all documentation for this visit. The documentation on 03/06/21 for the exam, diagnosis, procedures, and orders are all accurate and complete.    03/08/21, MD  [facility] [phone] (phone) [phone] (fax)  [facility]  

## 2021-05-17 ENCOUNTER — Other Ambulatory Visit: Payer: Self-pay | Admitting: Family Medicine

## 2021-05-17 DIAGNOSIS — R030 Elevated blood-pressure reading, without diagnosis of hypertension: Secondary | ICD-10-CM

## 2021-08-03 ENCOUNTER — Other Ambulatory Visit: Payer: Self-pay | Admitting: Family Medicine

## 2021-08-03 DIAGNOSIS — R030 Elevated blood-pressure reading, without diagnosis of hypertension: Secondary | ICD-10-CM

## 2021-08-04 NOTE — Telephone Encounter (Signed)
Requested Prescriptions  Pending Prescriptions Disp Refills  . amLODipine (NORVASC) 5 MG tablet [Pharmacy Med Name: AMLODIPINE BESYLATE 5 MG TAB] 30 tablet 2    Sig: TAKE 1 TABLET (5 MG TOTAL) BY MOUTH DAILY.     Cardiovascular:  Calcium Channel Blockers Passed - 08/03/2021 12:36 PM      Passed - Last BP in normal range    BP Readings from Last 1 Encounters:  03/01/21 129/86         Passed - Valid encounter within last 6 months    Recent Outpatient Visits          5 months ago Primary hypertension   [facility] Maple Hudson., MD   8 months ago Primary hypertension   [facility] [place], Marzella Schlein, MD   8 months ago Elevated blood pressure reading   [facility] [place], Lavella [place], [place]   1 year ago No-show for appointment   [facility] Osvaldo Angst M, [place]   2 years ago Internal hemorrhoid, bleeding   [facility] Maple Hudson., MD      Future Appointments            In 1 month Maple Hudson., MD [facility]

## 2021-09-05 ENCOUNTER — Ambulatory Visit: Payer: Self-pay | Admitting: *Deleted

## 2021-09-05 NOTE — Telephone Encounter (Signed)
Reason for Disposition  Urinating more frequently than usual (i.e., frequency)  Answer Assessment - Initial Assessment Questions 1. SYMPTOM: "What's the main symptom you're concerned about?" (e.g., frequency, incontinence)     Urinary frequency, back pain and fever 102.8 2. ONSET: "When did the  fever  start?"     Yesterday morning 3. PAIN: "Is there any pain?" If Yes, ask: "How bad is it?" (Scale: 1-10; mild, moderate, severe)     Yes on both sides of lower back. It's really light my urine because I'm drinking a lot of water.   But I'm going frequently.   No burning.   No cloudy urine or discharge. I have chest congestion too. 4. CAUSE: "What do you think is causing the symptoms?"     Not asked 5. OTHER SYMPTOMS: "Do you have any other symptoms?" (e.g., fever, flank pain, blood in urine, pain with urination)     Fever, cough, chest congestion, low back pain.  I was fine on Sunday then yesterday all these symptoms started yesterday morning.  He has not tested for Covid.   6. PREGNANCY: "Is there any chance you are pregnant?" "When was your last menstrual period?"     N/A  Protocols used: Urinary Symptoms-A-AH

## 2021-09-05 NOTE — Telephone Encounter (Signed)
°  Chief Complaint: urinary frequency, lower back pain, fever 102.8, coughing, congestion, URI symptoms Symptoms: See above Frequency: Started yesterday morning Pertinent Negatives: Patient denies hx of UTI or kidney stones Disposition: [] ED /[x] Urgent Care (no appt availability in office) / [] Appointment(In office/virtual)/ []  [facility]/ [] Home Care/ [] Refused Recommended Disposition  Additional Notes: There are no appts available with [facility] so I have referred him to the urgent care.  He is agreeable to this plan.    I returned his call.   He had called in earlier today c/o fever, lower back pain and urinary frequency.   He told me about the URI after I returned his call.

## 2021-09-19 ENCOUNTER — Encounter: Payer: Self-pay | Admitting: Family Medicine

## 2021-09-19 ENCOUNTER — Encounter: Payer: 59 | Admitting: Family Medicine

## 2021-09-19 ENCOUNTER — Ambulatory Visit: Payer: 59 | Admitting: Family Medicine

## 2021-09-19 NOTE — Progress Notes (Deleted)
There were no vitals taken for this visit.   Subjective:    Patient ID: Kyle Graves, male    DOB: [birth year], 26 y.o.   MRN: [id number]  HPI: Kyle Graves is a 26 y.o. male presenting on 09/19/2021 for comprehensive medical examination. Current medical complaints include:{Blank single:19197::"none","***"}  He currently lives with: Interim Problems from his last visit: {Blank single:19197::"yes","no"}  Depression Screen done today and results listed below:  [facility] 11/15/2020 06/04/2017 03/01/2016  Decreased Interest 0 1 0  Down, Depressed, Hopeless 0 0 0  PHQ - 2 Score 0 1 0  Altered sleeping 0 - -  Tired, decreased energy 0 - -  Change in appetite 0 - -  Feeling bad or failure about yourself  0 - -  Trouble concentrating 0 - -  Moving slowly or fidgety/restless 0 - -  Suicidal thoughts 0 - -  PHQ-9 Score 0 - -  Difficult doing work/chores Not difficult at all - -    The patient {has/does not VHQI:69629}have:19849} a history of falls. I {did/did not:19850} complete a risk assessment for falls. A plan of care for falls {was/was not:19852} documented.   Past Medical History:  No past medical history on file.  Surgical History:  No past surgical history on file.  Medications:  Current Outpatient Medications on File Prior to Visit  Medication Sig   amLODipine (NORVASC) 5 MG tablet TAKE 1 TABLET (5 MG TOTAL) BY MOUTH DAILY.   No current facility-administered medications on file prior to visit.    Allergies:  No Known Allergies  Social History:  Social History   Socioeconomic History   Marital status: Single    Spouse name: Not on file   Number of children: Not on file   Years of education: Not on file   Highest education level: Not on file  Occupational History   Occupation: Production designer, theatre/television/filmManager at [employer], Full time  Tobacco Use   Smoking status: Former    Packs/day: 0.25    Years: 3.00    Pack years: 0.75    Types: Cigarettes    Quit date:  06/10/2018    Years since quitting: 3.2   Smokeless tobacco: Never  Vaping Use   Vaping Use: Never used  Substance and Sexual Activity   Alcohol use: Yes    Comment: everyday, beer and liquor average 8 beers a day   Drug use: No   Sexual activity: Yes  Other Topics Concern   Not on file  Social History Narrative   Not on file   Social Determinants of Health   Financial Resource Strain: Not on file  Food Insecurity: Not on file  Transportation Needs: Not on file  Physical Activity: Not on file  Stress: Not on file  Social Connections: Not on file  Intimate Partner Violence: Not on file   Social History   Tobacco Use  Smoking Status Former   Packs/day: 0.25   Years: 3.00   Pack years: 0.75   Types: Cigarettes   Quit date: 06/10/2018   Years since quitting: 3.2  Smokeless Tobacco Never   Social History   Substance and Sexual Activity  Alcohol Use Yes   Comment: everyday, beer and liquor average 8 beers a day    Family History:  Family History  Problem Relation Age of Onset   Diabetes Paternal Uncle    Hypertension Paternal Uncle    Heart disease Maternal Grandfather    Diabetes Paternal Uncle  Hypertension Paternal Uncle     Past medical history, surgical history, medications, allergies, family history and social history reviewed with patient today and changes made to appropriate areas of the chart.      Objective:    There were no vitals taken for this visit.  Wt Readings from Last 3 Encounters:  03/01/21 180 lb (81.6 kg)  12/01/20 180 lb (81.6 kg)  11/15/20 185 lb 6.4 oz (84.1 kg)    Physical Exam  Results for orders placed or performed in visit on 12/01/20  POCT glycosylated hemoglobin (Hb A1C)  Result Value Ref Range   Hemoglobin A1C 5.4 4.0 - 5.6 %   HbA1c POC (<> result, manual entry)     HbA1c, POC (prediabetic range)     HbA1c, POC (controlled diabetic range)        Assessment & Plan:   Problem List Items Addressed This Visit    None    LABORATORY TESTING:  Health maintenance labs ordered today as discussed above.   The natural history of prostate cancer and ongoing controversy regarding screening and potential treatment outcomes of prostate cancer has been discussed with the patient. The meaning of a false positive PSA and a false negative PSA has been discussed. He indicates understanding of the limitations of this screening test and wishes *** to proceed with screening PSA testing.   IMMUNIZATIONS:   - Tdap: Tetanus vaccination status reviewed: {tetanus status:315746}. - Influenza: {Blank single:19197::"Up to date","Administered today","Postponed to flu season","Refused","Given elsewhere"} - Pneumococcal: {Blank single:19197::"Up to date","Administered today","Not applicable","Refused","Given elsewhere"} - HPV: {Blank single:19197::"Up to date","Administered today","Not applicable","Refused","Given elsewhere"} - Shingrix vaccine: {Blank single:19197::"Up to date","Administered today","Not applicable","Refused","Given elsewhere"} - COVID vaccine: ***  SCREENING: - Colonoscopy: {Blank single:19197::"Up to date","Ordered today","Not applicable","Refused","Done elsewhere"}  Discussed with patient purpose of the colonoscopy is to detect colon cancer at curable precancerous or early stages   - AAA Screening: {Blank single:19197::"Up to date","Ordered today","Not applicable","Refused","Done elsewhere"}  - Lung cancer screening: ***  Hep C Screening: *** STD testing and prevention (HIV/chl/gon/syphilis): *** Sexual History: Incontinence Symptoms:   PATIENT COUNSELING:    Advanced Care Planning: A voluntary discussion about advance care planning including the explanation and discussion of advance directives.  Discussed health care proxy and Living will, and the patient was able to identify a health care proxy as ***.  Patient {DOES_DOES KYH:06237} have a living will at present time. If patient does have living  will, I have requested they bring this to the clinic to be scanned in to their chart.  Sexuality: Discussed sexually transmitted diseases, partner selection, use of condoms, avoidance of unintended pregnancy  and contraceptive alternatives.   Advised to avoid cigarette smoking.  I discussed with the patient that most people either abstain from alcohol or drink within safe limits (<=14/week and <=4 drinks/occasion for males, <=7/weeks and <= 3 drinks/occasion for females) and that the risk for alcohol disorders and other health effects rises proportionally with the number of drinks per week and how often a drinker exceeds daily limits.  Discussed cessation/primary prevention of drug use and availability of treatment for abuse.   Diet: Encouraged to adjust caloric intake to maintain  or achieve ideal body weight, to reduce intake of dietary saturated fat and total fat, to limit sodium intake by avoiding high sodium foods and not adding table salt, and to maintain adequate dietary potassium and calcium preferably from fresh fruits, vegetables, and low-fat dairy products.    stressed the importance of regular exercise  Injury prevention: Discussed  safety belts, safety helmets, smoke detector, smoking near bedding or upholstery.   Dental health: Discussed importance of regular tooth brushing, flossing, and dental visits.   Follow up plan: NEXT PREVENTATIVE PHYSICAL DUE IN 1 YEAR. No follow-ups on file.

## 2021-11-06 ENCOUNTER — Other Ambulatory Visit: Payer: Self-pay | Admitting: Family Medicine

## 2021-11-06 DIAGNOSIS — R030 Elevated blood-pressure reading, without diagnosis of hypertension: Secondary | ICD-10-CM

## 2021-11-07 NOTE — Telephone Encounter (Signed)
Called pt to make follow up appointment. Mailbox is full, I was unable to [facility].

## 2021-11-07 NOTE — Telephone Encounter (Signed)
Courtesy refill. Patient will need a follow up appointment for further refills. Called pt to schedule appointment - mailbox full. Requested Prescriptions  Pending Prescriptions Disp Refills   amLODipine (NORVASC) 5 MG tablet [Pharmacy Med Name: AMLODIPINE BESYLATE 5 MG TAB] 30 tablet 0    Sig: TAKE 1 TABLET (5 MG TOTAL) BY MOUTH DAILY.     Cardiovascular: Calcium Channel Blockers 2 Failed - 11/06/2021  1:41 AM      Failed - Valid encounter within last 6 months    Recent Outpatient Visits          8 months ago Primary hypertension   [facility] Maple Hudson., MD   11 months ago Primary hypertension   [facility] [place], Marzella Schlein, MD   11 months ago Elevated blood pressure reading   [facility] [place], Lavella [place], [place]   2 years ago No-show for appointment   [facility] Osvaldo Angst M, [place]   2 years ago Internal hemorrhoid, bleeding   [facility] Maple Hudson., MD             Passed - Last BP in normal range    BP Readings from Last 1 Encounters:  03/01/21 129/86         Passed - Last Heart Rate in normal range    Pulse Readings from Last 1 Encounters:  03/01/21 89

## 2021-12-06 ENCOUNTER — Other Ambulatory Visit: Payer: Self-pay | Admitting: Family Medicine

## 2021-12-06 DIAGNOSIS — R030 Elevated blood-pressure reading, without diagnosis of hypertension: Secondary | ICD-10-CM

## 2021-12-12 ENCOUNTER — Other Ambulatory Visit: Payer: Self-pay | Admitting: Family Medicine

## 2021-12-12 DIAGNOSIS — R030 Elevated blood-pressure reading, without diagnosis of hypertension: Secondary | ICD-10-CM

## 2021-12-13 ENCOUNTER — Other Ambulatory Visit: Payer: Self-pay | Admitting: Family Medicine

## 2021-12-13 DIAGNOSIS — R030 Elevated blood-pressure reading, without diagnosis of hypertension: Secondary | ICD-10-CM

## 2021-12-13 NOTE — Telephone Encounter (Signed)
Copied from CRM [id number]. Topic: Quick Communication - Rx Refill/Question ?>> Dec 13, 2021 10:57 AM Gaetana Michaelis A wrote: ?Medication: amLODipine (NORVASC) 5 MG tablet [[id number]] ? ?Has the patient contacted their pharmacy? Yes.   ?(Agent: If no, request that the patient contact the pharmacy for the refill. If patient does not wish to contact the pharmacy document the reason why and proceed with request.) ?(Agent: If yes, when and what did the pharmacy advise?) ? ?Preferred Pharmacy (with phone number or street name): [phone] [place], [place] - [address] ?[address] [place] [address] ?Phone: [phone] Fax: [phone] ?Hours: Not open 24 hours ? ?Has the patient been seen for an appointment in the last year OR does the patient have an upcoming appointment? Yes.   ? ?Agent: Please be advised that RX refills may take up to 3 business days. We ask that you follow-up with your pharmacy. ?

## 2021-12-13 NOTE — Telephone Encounter (Signed)
Requested Prescriptions  ?Pending Prescriptions Disp Refills  ?? amLODipine (NORVASC) 5 MG tablet [Pharmacy Med Name: AMLODIPINE BESYLATE 5 MG TAB] 30 tablet 0  ?  Sig: TAKE 1 TABLET (5 MG TOTAL) BY MOUTH DAILY.  ?  ? Cardiovascular: Calcium Channel Blockers 2 Failed - 12/12/2021  6:37 PM  ?  ?  Failed - Valid encounter within last 6 months  ?  Recent Outpatient Visits   ?      ? 9 months ago Primary hypertension  ? [facility] Jerrol Banana., MD  ? 1 year ago Primary hypertension  ? [facility] [place], Dionne Bucy, MD  ? 1 year ago Elevated blood pressure reading  ? [facility] [place], [place], [place]  ? 2 years ago No-show for appointment  ? [place], [place]  ? 2 years ago Internal hemorrhoid, bleeding  ? [facility] Jerrol Banana., MD  ?  ?  ?Future Appointments   ?        ? In 6 days Jerrol Banana., MD [facility]  ?  ? ?  ?  ?  Passed - Last BP in normal range  ?  BP Readings from Last 1 Encounters:  ?03/01/21 129/86  ?   ?  ?  Passed - Last Heart Rate in normal range  ?  Pulse Readings from Last 1 Encounters:  ?03/01/21 89  ?   ?  ?  ? ? ?

## 2021-12-14 MED ORDER — AMLODIPINE BESYLATE 5 MG PO TABS: 5.0000 mg | ORAL_TABLET | Freq: Every day | ORAL | 0 refills | Status: DC

## 2021-12-14 NOTE — Telephone Encounter (Signed)
Requested Prescriptions  ?Pending Prescriptions Disp Refills  ?? amLODipine (NORVASC) 5 MG tablet 6 tablet 0  ?  Sig: Take 1 tablet (5 mg total) by mouth daily.  ?  ? Cardiovascular: Calcium Channel Blockers 2 Failed - 12/13/2021 12:19 PM  ?  ?  Failed - Valid encounter within last 6 months  ?  Recent Outpatient Visits   ?      ? 9 months ago Primary hypertension  ? [facility] Maple Hudson., MD  ? 1 year ago Primary hypertension  ? [facility] [place], Marzella Schlein, MD  ? 1 year ago Elevated blood pressure reading  ? [facility] [place], [place], [place]  ? 2 years ago No-show for appointment  ? [facility] [place], [place], [place]  ? 2 years ago Internal hemorrhoid, bleeding  ? [facility] Maple Hudson., MD  ?  ?  ?Future Appointments   ?        ? In 5 days Maple Hudson., MD [facility]  ?  ? ?  ?  ?  Passed - Last BP in normal range  ?  BP Readings from Last 1 Encounters:  ?03/01/21 129/86  ?   ?  ?  Passed - Last Heart Rate in normal range  ?  Pulse Readings from Last 1 Encounters:  ?03/01/21 89  ?   ?  ?  ? ? ?

## 2021-12-19 ENCOUNTER — Telehealth: Payer: 59 | Admitting: Family Medicine

## 2022-01-08 ENCOUNTER — Other Ambulatory Visit: Payer: Self-pay | Admitting: Family Medicine

## 2022-01-08 DIAGNOSIS — R030 Elevated blood-pressure reading, without diagnosis of hypertension: Secondary | ICD-10-CM

## 2022-01-09 NOTE — Telephone Encounter (Signed)
Pt had Virtual appt 12/19/21. ? ?Requested Prescriptions  ?Pending Prescriptions Disp Refills  ?? amLODipine (NORVASC) 5 MG tablet [Pharmacy Med Name: AMLODIPINE BESYLATE 5 MG TAB] 30 tablet 0  ?  Sig: TAKE 1 TABLET (5 MG TOTAL) BY MOUTH DAILY.  ?  ? Cardiovascular: Calcium Channel Blockers 2 Passed - 01/08/2022  2:30 AM  ?  ?  Passed - Last BP in normal range  ?  BP Readings from Last 1 Encounters:  ?03/01/21 129/86  ?   ?  ?  Passed - Last Heart Rate in normal range  ?  Pulse Readings from Last 1 Encounters:  ?03/01/21 89  ?   ?  ?  Passed - Valid encounter within last 6 months  ?  Recent Outpatient Visits   ?      ? 10 months ago Primary hypertension  ? [facility] Maple Hudson., MD  ? 1 year ago Primary hypertension  ? [facility] [place], Marzella Schlein, MD  ? 1 year ago Elevated blood pressure reading  ? [facility] [place], [place], [place]  ? 2 years ago No-show for appointment  ? [facility] [place], [place], [place]  ? 2 years ago Internal hemorrhoid, bleeding  ? [facility] Maple Hudson., MD  ?  ?  ? ?  ?  ?  ? ? ?

## 2022-06-07 ENCOUNTER — Ambulatory Visit: Payer: 59 | Admitting: Physician Assistant

## 2022-06-07 NOTE — Progress Notes (Deleted)
      Established patient visit   Patient: Kyle Graves   DOB: 08/30/1996   26 y.o. Male  MRN: [id number] Visit Date: 06/07/2022  Today's healthcare provider: Mardene Speak, PA-C   No chief complaint on file.  Subjective    HPI  Hypertension, follow-up  BP Readings from Last 3 Encounters:  03/01/21 129/86  12/01/20 127/87  11/15/20 (!) 148/109   Wt Readings from Last 3 Encounters:  03/01/21 180 lb (81.6 kg)  12/01/20 180 lb (81.6 kg)  11/15/20 185 lb 6.4 oz (84.1 kg)     He was last seen for hypertension on 03/01/2021.   BP at that visit was 129/86. Management since that visit includes continuing Amlodipine.  He reports {excellent/good/fair/poor:19665} compliance with treatment. He {is/is not:9024} having side effects. {document side effects if present:1} He is following a {diet:21022986} diet. He {is/is not:9024} exercising. He {does/does not:200015} smoke.  Use of agents associated with hypertension: none.   Outside blood pressures are {***enter patient reported home BP readings, or 'not being checked':1}. Symptoms: {Yes/No:20286} chest pain {Yes/No:20286} chest pressure  {Yes/No:20286} palpitations {Yes/No:20286} syncope  {Yes/No:20286} dyspnea {Yes/No:20286} orthopnea  {Yes/No:20286} paroxysmal nocturnal dyspnea {Yes/No:20286} lower extremity edema   Pertinent labs No results found for: "CHOL", "HDL", "LDLCALC", "LDLDIRECT", "TRIG", "CHOLHDL" Lab Results  Component Value Date   NA 141 11/15/2020   K 4.0 11/15/2020   CREATININE 0.90 11/15/2020   EGFR 122 11/15/2020   GLUCOSE 128 (H) 11/15/2020     The ASCVD Risk score (Arnett DK, et al., 2019) failed to calculate for the following reasons:   The 2019 ASCVD risk score is only valid for ages 74 to 13  ---------------------------------------------------------------------------------------------------   Medications: Outpatient Medications Prior to Visit  Medication Sig   amLODipine (NORVASC) 5  MG tablet Take 1 tablet (5 mg total) by mouth daily.   amLODipine (NORVASC) 5 MG tablet TAKE 1 TABLET (5 MG TOTAL) BY MOUTH DAILY.   No facility-administered medications prior to visit.    Review of Systems  {Labs  Heme  Chem  Endocrine  Serology  Results Review (optional):23779}   Objective    There were no vitals taken for this visit. {Show previous vital signs (optional):23777}  Physical Exam  ***  No results found for any visits on 06/07/22.  Assessment & Plan     ***  No follow-ups on file.      {provider attestation***:1}   Mardene Speak, Hershal Coria  [facility] [phone] (phone) [phone] (fax)  [place]

## 2022-06-11 NOTE — Progress Notes (Deleted)
      Established patient visit   Patient: Kyle Graves   DOB: 03/03/1996   26 y.o. Male  MRN: [id number] Visit Date: 06/12/2022  Today's healthcare provider: Mikey Kirschner, PA-C   No chief complaint on file.  Subjective    HPI  ***  Medications: Outpatient Medications Prior to Visit  Medication Sig   amLODipine (NORVASC) 5 MG tablet Take 1 tablet (5 mg total) by mouth daily.   amLODipine (NORVASC) 5 MG tablet TAKE 1 TABLET (5 MG TOTAL) BY MOUTH DAILY.   No facility-administered medications prior to visit.    Review of Systems  {Labs  Heme  Chem  Endocrine  Serology  Results Review (optional):23779}   Objective    There were no vitals taken for this visit. {Show previous vital signs (optional):23777}  Physical Exam  ***  No results found for any visits on 06/12/22.  Assessment & Plan     ***  No follow-ups on file.      {provider attestation***:1}   Mikey Kirschner, PA-C  [facility] [phone] (phone) [phone] (fax)  [place]

## 2022-06-12 ENCOUNTER — Ambulatory Visit: Payer: 59 | Admitting: Physician Assistant

## 2022-06-17 ENCOUNTER — Encounter: Payer: Self-pay | Admitting: Emergency Medicine

## 2022-06-17 ENCOUNTER — Other Ambulatory Visit: Payer: Self-pay

## 2022-06-17 ENCOUNTER — Emergency Department: Payer: 59

## 2022-06-17 DIAGNOSIS — R519 Headache, unspecified: Secondary | ICD-10-CM | POA: Diagnosis present

## 2022-06-17 DIAGNOSIS — I10 Essential (primary) hypertension: Secondary | ICD-10-CM | POA: Diagnosis not present

## 2022-06-17 LAB — BASIC METABOLIC PANEL
Anion gap: 11 (ref 5–15)
BUN: 8 mg/dL (ref 6–20)
CO2: 26 mmol/L (ref 22–32)
Calcium: 9.5 mg/dL (ref 8.9–10.3)
Chloride: 102 mmol/L (ref 98–111)
Creatinine, Ser: 0.81 mg/dL (ref 0.61–1.24)
GFR, Estimated: >60 mL/min (ref >60)
Glucose, Bld: 137 mg/dL — ABNORMAL HIGH (ref 70–99)
Potassium: 3.8 mmol/L (ref 3.5–5.1)
Sodium: 139 mmol/L (ref 135–145)

## 2022-06-17 LAB — CBC
HCT: 45.7 % (ref 39.0–52.0)
Hemoglobin: 15.4 g/dL (ref 13.0–17.0)
MCH: 29.9 pg (ref 26.0–34.0)
MCHC: 33.7 g/dL (ref 30.0–36.0)
MCV: 88.7 fL (ref 80.0–100.0)
Platelets: 213 10*3/uL (ref 150–400)
RBC: 5.15 MIL/uL (ref 4.22–5.81)
RDW: 13.3 % (ref 11.5–15.5)
WBC: 5.5 10*3/uL (ref 4.0–10.5)
nRBC: 0 % (ref 0.0–0.2)

## 2022-06-17 LAB — TROPONIN I (HIGH SENSITIVITY): Troponin I (High Sensitivity): 3 ng/L (ref <18)

## 2022-06-17 NOTE — ED Triage Notes (Signed)
Pt to ED from home c/o HTN and left side chest pain today.  States pain with breathing in.  Hx of HTN and BP was 160/117 today when checked.  Was on medications for BP but stopped taking a couple months ago.  Reports headache and some visual changes.  Pt A&Ox4, chest rise even and unlabored, skin WNL and in NAD at this time.

## 2022-06-18 ENCOUNTER — Emergency Department
Admission: EM | Admit: 2022-06-18 | Discharge: 2022-06-18 | Disposition: A | Discharge status: Home / Self Care | Payer: 59 | Attending: Emergency Medicine | Admitting: Emergency Medicine

## 2022-06-18 DIAGNOSIS — I10 Essential (primary) hypertension: Secondary | ICD-10-CM

## 2022-06-18 HISTORY — DX: Essential (primary) hypertension: I10

## 2022-06-18 LAB — TROPONIN I (HIGH SENSITIVITY): Troponin I (High Sensitivity): 4 ng/L (ref <18)

## 2022-06-18 MED ORDER — AMLODIPINE BESYLATE 5 MG PO TABS
5.0000 mg | ORAL_TABLET | Freq: Once | ORAL | Status: AC
  Administered 2022-06-18: 5 mg via ORAL
  Filled 2022-06-18: qty 1

## 2022-06-18 MED ORDER — AMLODIPINE BESYLATE 5 MG PO TABS: 5.0000 mg | ORAL_TABLET | Freq: Every day | ORAL | 2 refills | Status: DC

## 2022-06-18 NOTE — ED Provider Notes (Signed)
[facility] Provider Note    Event Date/Time   First MD Initiated Contact with Patient 06/18/22 0247     (approximate)   History   Hypertension and Chest Pain   HPI  Kyle Graves is a 26 y.o. male previously diagnosed with hypertension who presents for evaluation of high blood pressure.  He said that he was diagnosed with a a while ago and was on amlodipine.  However he ran out of the amlodipine and his primary care doctor, Dr. Sullivan Lone, is no longer at the clinic.  The patient has a follow-up appointment in 2 days but he said that his blood pressure was surprisingly high today.  He said that he checks his blood pressure every day and he felt fine today but when he checked his pressure it was quite elevated in the 170/100 range.  After he discovered that, it scared him, and he started feeling other symptoms such as a headache, some visual changes, and some left-sided chest pain.  He was very clear that all of the symptoms started after he discovered that his blood pressure was elevated.  He is feeling better now but is very anxious about his blood pressure being high.  He said he does not feel that he can wait 2 days to see his primary care doctor before he starts back up on medicine.  He has had no other medication changes or new medications recently.  He denies drug and alcohol use.  No recent fever nor shortness of breath.     Physical Exam   Triage Vital Signs: ED Triage Vitals  Enc Vitals Group     BP 06/17/22 2217 (!) 142/101     Pulse Rate 06/17/22 2217 88     Resp 06/17/22 2217 16     Temp 06/17/22 2217 98.5 F (36.9 C)     Temp Source 06/17/22 2217 Oral     SpO2 06/17/22 2217 98 %     Weight 06/17/22 2211 83.9 kg (185 lb)     Height 06/17/22 2211 1.778 m (5\' 10" )     Head Circumference --      Peak Flow --      Pain Score 06/17/22 2211 5     Pain Loc --      Pain Edu? --      Excl. in GC? --     Most recent vital signs: Vitals:    06/18/22 0217 06/18/22 0401  BP: (!) 156/117 (!) 162/98  Pulse: 66 68  Resp: 20 18  Temp: 97.9 F (36.6 C)   SpO2: 100% 100%     General: Awake, no distress.  CV:  Good peripheral perfusion.  Normal heart sounds.  Regular rate and rhythm. Resp:  Normal effort.  Lungs are clear to auscultation bilaterally. Abd:  No distention.  No tenderness to palpation. Other:  Patient is calm and cooperative although clearly anxious over his hypertension.  Mood and affect are normal.   ED Results / Procedures / Treatments   Labs (all labs ordered are listed, but only abnormal results are displayed) Labs Reviewed  BASIC METABOLIC PANEL - Abnormal; Notable for the following components:      Result Value   Glucose, Bld 137 (*)    All other components within normal limits  CBC  TROPONIN I (HIGH SENSITIVITY)  TROPONIN I (HIGH SENSITIVITY)     EKG  ED ECG REPORT I, 08/18/22, the attending physician, personally viewed and interpreted this ECG.  Date: 06/17/2022 EKG Time: 22:14 Rate: 89 Rhythm: normal sinus rhythm QRS Axis: normal Intervals: normal ST/T Wave abnormalities: Non-specific ST segment / T-wave changes, but no clear evidence of acute ischemia. Narrative Interpretation: no definitive evidence of acute ischemia; does not meet STEMI criteria.    RADIOLOGY I viewed and interpreted the patient's two-view chest x-ray.  I see no evidence of pneumonia, pneumothorax, nor other acute abnormality.  I also read the radiologist's report, which confirmed no acute findings.    PROCEDURES:  Critical Care performed: No  Procedures   MEDICATIONS ORDERED IN ED: Medications  amLODipine (NORVASC) tablet 5 mg (5 mg Oral Given 06/18/22 0330)     IMPRESSION / MDM / ASSESSMENT AND PLAN / ED COURSE  I reviewed the triage vital signs and the nursing notes.                              Differential diagnosis includes, but is not limited to, essential hypertension, uncontrolled  hypertension, hypertensive emergency, ACS, PE, medication or drug side effect, renal dysfunction.  Patient's presentation is most consistent with acute presentation with potential threat to life or bodily function.  Vital signs are normal other than persistent hypertension although it is better than it was for him at home.  Labs/studies ordered: High-sensitivity troponin x2, basic metabolic panel, CBC, two-view chest x-ray, EKG.  I interpreted the results of his labs and studies.  As documented above, two-view chest x-ray is normal.  There is no evidence of ischemia on EKG.  Labs are all within normal limits.  We had a talk about uncontrolled hypertension and the need for him to take his medications, but also the role that anxiety is likely playing into the constellation of symptoms.  He is comfortable with the plan to restart amlodipine 5 mg by mouth which I prescribed for him and he will follow-up with his PCP in 2 days.  I also ordered a dose of amlodipine 5 mg p.o. in the emergency department.  He is asymptomatic at this time and he and his father both comfortable going home and following up.  I gave my usual and customary return precautions.       FINAL CLINICAL IMPRESSION(S) / ED DIAGNOSES   Final diagnoses:  Uncontrolled hypertension     Rx / DC Orders   ED Discharge Orders          Ordered    amLODipine (NORVASC) 5 MG tablet  Daily        06/18/22 0344             Note:  This document was prepared using Dragon voice recognition software and may include unintentional dictation errors.   Hinda Kehr, MD 06/18/22 1040

## 2022-06-18 NOTE — Discharge Instructions (Signed)
As we discussed, though you do have high blood pressure (hypertension), fortunately it is not immediately dangerous at this time and does not need emergency intervention or admission to the hospital.  We are starting you back on your prescription for amlodipine.  Please follow up in clinic as recommended in these papers.    Return to the Emergency Department (ED) if you experience any worsening chest pain/pressure/tightness, difficulty breathing, or sudden sweating, or other symptoms that concern you.

## 2022-06-20 ENCOUNTER — Ambulatory Visit: Payer: Self-pay

## 2022-06-21 ENCOUNTER — Other Ambulatory Visit (HOSPITAL_COMMUNITY)
Admission: RE | Admit: 2022-06-21 | Discharge: 2022-06-21 | Disposition: A | Discharge status: Home / Self Care | Payer: Commercial Managed Care - PPO | Source: Ambulatory Visit | Attending: Physician Assistant | Admitting: Physician Assistant

## 2022-06-21 ENCOUNTER — Encounter: Payer: Self-pay | Admitting: Physician Assistant

## 2022-06-21 ENCOUNTER — Ambulatory Visit (INDEPENDENT_AMBULATORY_CARE_PROVIDER_SITE_OTHER): Payer: 59 | Admitting: Physician Assistant

## 2022-06-21 VITALS — BP 122/87 | HR 78 | Resp 16 | Wt 184.0 lb

## 2022-06-21 DIAGNOSIS — R7989 Other specified abnormal findings of blood chemistry: Secondary | ICD-10-CM

## 2022-06-21 DIAGNOSIS — Z113 Encounter for screening for infections with a predominantly sexual mode of transmission: Secondary | ICD-10-CM | POA: Diagnosis not present

## 2022-06-21 DIAGNOSIS — E663 Overweight: Secondary | ICD-10-CM

## 2022-06-21 DIAGNOSIS — R739 Hyperglycemia, unspecified: Secondary | ICD-10-CM

## 2022-06-21 DIAGNOSIS — I10 Essential (primary) hypertension: Secondary | ICD-10-CM

## 2022-06-21 DIAGNOSIS — F1010 Alcohol abuse, uncomplicated: Secondary | ICD-10-CM

## 2022-06-21 DIAGNOSIS — F1411 Cocaine abuse, in remission: Secondary | ICD-10-CM

## 2022-06-21 NOTE — Progress Notes (Signed)
I,April Miller,acting as a Education administrator for [employer], PA-C.,have documented all relevant documentation on the behalf of Mardene Speak, PA-C,as directed by  [employer], PA-C while in the presence of [employer], PA-C.   Established patient visit   Patient: Kyle Graves   DOB: 19-Apr-1996   26 y.o. Male  MRN: [id number] Visit Date: 06/21/2022  Today's healthcare provider: Mardene Speak, PA-C   CC: exposure to STD?  Subjective    HPI  Patient was exposed to std from his girlfriend. Hsv 1 and 2. Requested a proper evaluation for STDs Denies having herpetic rash now. Endorses having the rash before Has been having an one partner for the past 4-5 years  Medications: Outpatient Medications Prior to Visit  Medication Sig   amLODipine (NORVASC) 5 MG tablet Take 1 tablet (5 mg total) by mouth daily.   No facility-administered medications prior to visit.    Review of Systems  Constitutional:  Negative for appetite change, chills and fever.  Respiratory:  Negative for chest tightness, shortness of breath and wheezing.   Cardiovascular:  Negative for chest pain and palpitations.  Gastrointestinal:  Negative for abdominal pain, nausea and vomiting.       Objective    BP 122/87 (BP Location: Right Arm, Patient Position: Sitting, Cuff Size: Large)   Pulse 78   Resp 16   Wt 184 lb (83.5 kg)   SpO2 99%   BMI 26.40 kg/m    Physical Exam Constitutional:      General: He is not in acute distress.    Appearance: Normal appearance. He is not diaphoretic.  HENT:     Head: Normocephalic.  Eyes:     Conjunctiva/sclera: Conjunctivae normal.  Pulmonary:     Effort: Pulmonary effort is normal. No respiratory distress.  Neurological:     Mental Status: He is alert and oriented to person, place, and time. Mental status is at baseline.  Psychiatric:        Behavior: Behavior normal.        Thought Content: Thought content normal.        Judgment: Judgment normal.       No results found for any visits on 06/21/22.  Assessment & Plan     1. Primary hypertension Chronic and stable  2. Alcohol abuse Cessation advised  3. Hyperglycemia Needs labs  4. Elevated LFTs Needs labs  5. Overweight Needs labs  6. History of cocaine abuse ([place])  7. Screening for STD (sexually transmitted disease) Per pt request and after discussing all option, initial workup: - Urine cytology ancillary only - HSV(herpes simplex vrs) 1+2 ab-IgG - HIV antibody (with reflex) - RPR   Advised CPE and labs  The patient was advised to call back or seek an in-person evaluation if the symptoms worsen or if the condition fails to improve as anticipated.  I discussed the assessment and treatment plan with the patient. The patient was provided an opportunity to ask questions and all were answered. The patient agreed with the plan and demonstrated an understanding of the instructions.  The entirety of the information documented in the History of Present Illness, Review of Systems and Physical Exam were personally obtained by me. Portions of this information were initially documented by the CMA and reviewed by me for thoroughness and accuracy.  Portions of this note were created using dictation software and may contain typographical errors.      Mardene Speak, PA-C  [facility] [phone] (phone) [phone] (  fax)  [place]

## 2022-06-22 ENCOUNTER — Encounter: Payer: Self-pay | Admitting: Physician Assistant

## 2022-06-22 ENCOUNTER — Other Ambulatory Visit: Payer: Self-pay | Admitting: Physician Assistant

## 2022-06-22 LAB — RPR: RPR Ser Ql: NONREACTIVE

## 2022-06-22 LAB — HSV 1 AND 2 AB, IGG
HSV 1 Glycoprotein G Ab, IgG: 39.6 index — ABNORMAL HIGH (ref 0.00–0.90)
HSV 2 IgG, Type Spec: <0.91 index (ref 0.00–0.90)

## 2022-06-22 LAB — HIV ANTIBODY (ROUTINE TESTING W REFLEX): HIV Screen 4th Generation wRfx: NONREACTIVE

## 2022-06-25 LAB — URINE CYTOLOGY ANCILLARY ONLY
Bacterial Vaginitis-Urine: NEGATIVE
Candida Urine: NEGATIVE
Chlamydia: NEGATIVE
Comment: NEGATIVE
Comment: NEGATIVE
Comment: NORMAL
Neisseria Gonorrhea: NEGATIVE
Trichomonas: NEGATIVE

## 2022-06-26 NOTE — Progress Notes (Signed)
Communicated with pt via phone about his results. We discussed his options. We will address this matter in November during his physical

## 2022-07-19 ENCOUNTER — Encounter: Payer: Self-pay | Admitting: Physician Assistant

## 2022-07-19 ENCOUNTER — Ambulatory Visit (INDEPENDENT_AMBULATORY_CARE_PROVIDER_SITE_OTHER): Payer: Self-pay | Admitting: Physician Assistant

## 2022-07-19 VITALS — BP 114/87 | HR 82 | Resp 16 | Ht 70.0 in | Wt 180.0 lb

## 2022-07-19 DIAGNOSIS — Z0000 Encounter for general adult medical examination without abnormal findings: Secondary | ICD-10-CM

## 2022-07-19 DIAGNOSIS — R7989 Other specified abnormal findings of blood chemistry: Secondary | ICD-10-CM

## 2022-07-19 DIAGNOSIS — Z833 Family history of diabetes mellitus: Secondary | ICD-10-CM

## 2022-07-19 DIAGNOSIS — R739 Hyperglycemia, unspecified: Secondary | ICD-10-CM

## 2022-07-19 DIAGNOSIS — I10 Essential (primary) hypertension: Secondary | ICD-10-CM

## 2022-07-19 NOTE — Progress Notes (Signed)
I,Roshena L Chambers,acting as a Education administrator for [employer], PA-C.,have documented all relevant documentation on the behalf of Mardene Speak, PA-C,as directed by  [employer], PA-C while in the presence of [employer], PA-C.    Complete physical exam   Patient: Kyle Graves   DOB: Nov 06, 1995   26 y.o. Male  MRN: [id number] Visit Date: 07/19/2022  Today's healthcare provider: Mardene Speak, PA-C   Chief Complaint  Patient presents with   Annual Exam   Subjective    Kyle Graves is a 26 y.o. male who presents today for a complete physical exam.  HPI He reports consuming a general diet. The patient does not participate in regular exercise at present. He generally feels fairly well. He reports sleeping fairly well. He does not have additional problems to discuss today.     Past Medical History:  Diagnosis Date   Hypertension    No past surgical history on file. Social History   Socioeconomic History   Marital status: Single    Spouse name: Not on file   Number of children: Not on file   Years of education: Not on file   Highest education level: Not on file  Occupational History   Occupation: Freight forwarder at [employer], Full time  Tobacco Use   Smoking status: Every Day    Types: E-cigarettes   Smokeless tobacco: Never  Vaping Use   Vaping Use: Every day  Substance and Sexual Activity   Alcohol use: Yes    Comment: everyday, beer and liquor average 8 beers a day   Drug use: No   Sexual activity: Yes  Other Topics Concern   Not on file  Social History Narrative   Not on file   Social Determinants of Health   Financial Resource Strain: Not on file  Food Insecurity: Not on file  Transportation Needs: Not on file  Physical Activity: Not on file  Stress: Not on file  Social Connections: Not on file  Intimate Partner Violence: Not on file   Family Status  Relation Name Status   Mother  Alive   Father  Alive   Annamarie Major  Alive   MGF  (Not  Specified)   Annamarie Major  Deceased   Family History  Problem Relation Age of Onset   Diabetes Paternal Uncle    Hypertension Paternal Uncle    Heart disease Maternal Grandfather    Diabetes Paternal Uncle    Hypertension Paternal Uncle    No Known Allergies  Patient Care Team: Jerrol Banana., MD as PCP - General (Family Medicine)   Medications: Outpatient Medications Prior to Visit  Medication Sig   amLODipine (NORVASC) 5 MG tablet Take 1 tablet (5 mg total) by mouth daily.   No facility-administered medications prior to visit.    Review of Systems  Constitutional:  Negative for appetite change, chills, fatigue and fever.  HENT:  Negative for congestion, ear pain, hearing loss, nosebleeds and trouble swallowing.   Eyes:  Negative for pain and visual disturbance.  Respiratory:  Negative for cough, chest tightness, shortness of breath and wheezing.   Cardiovascular:  Negative for chest pain, palpitations and leg swelling.  Gastrointestinal:  Negative for abdominal pain, blood in stool, constipation, diarrhea, nausea and vomiting.  Endocrine: Negative for polydipsia, polyphagia and polyuria.  Genitourinary:  Negative for dysuria and flank pain.  Musculoskeletal:  Negative for arthralgias, back pain, joint swelling, myalgias and neck stiffness.  Skin:  Negative for color change,  rash and wound.  Neurological:  Negative for dizziness, tremors, seizures, speech difficulty, weakness, light-headedness and headaches.  Psychiatric/Behavioral:  Negative for behavioral problems, confusion, decreased concentration, dysphoric mood and sleep disturbance. The patient is not nervous/anxious.   All other systems reviewed and are negative.     Objective    BP 114/87 (BP Location: Left Arm, Patient Position: Sitting, Cuff Size: Large)   Pulse 82   Resp 16   Ht _0  (1.778 m)   Wt 180 lb (81.6 kg)   SpO2 99% Comment: room air  BMI 25.83 kg/m     Physical Exam Vitals reviewed.   Constitutional:      General: He is not in acute distress.    Appearance: Normal appearance. He is well-developed. He is not diaphoretic.  HENT:     Head: Normocephalic and atraumatic.     Right Ear: Tympanic membrane, ear canal and external ear normal.     Left Ear: Tympanic membrane, ear canal and external ear normal.     Nose: Congestion and rhinorrhea present.     Mouth/Throat:     Mouth: Mucous membranes are moist.     Pharynx: Oropharynx is clear. No oropharyngeal exudate.  Eyes:     General: No scleral icterus.    Extraocular Movements: Extraocular movements intact.     Conjunctiva/sclera: Conjunctivae normal.     Pupils: Pupils are equal, round, and reactive to light.  Neck:     Thyroid: No thyromegaly.  Cardiovascular:     Rate and Rhythm: Normal rate and regular rhythm.     Pulses: Normal pulses.     Heart sounds: Normal heart sounds. No murmur heard. Pulmonary:     Effort: Pulmonary effort is normal. No respiratory distress.     Breath sounds: Normal breath sounds. No wheezing or rales.  Abdominal:     General: Abdomen is flat. Bowel sounds are normal. There is no distension.     Palpations: Abdomen is soft.     Tenderness: There is no abdominal tenderness.  Musculoskeletal:        General: No deformity. Normal range of motion.     Cervical back: Normal range of motion and neck supple.     Right lower leg: No edema.     Left lower leg: No edema.  Lymphadenopathy:     Cervical: No cervical adenopathy.  Skin:    General: Skin is warm and dry.     Findings: No rash.  Neurological:     General: No focal deficit present.     Mental Status: He is alert and oriented to person, place, and time. Mental status is at baseline.     Sensory: No sensory deficit.     Motor: No weakness.     Gait: Gait normal.  Psychiatric:        Mood and Affect: Mood normal.        Behavior: Behavior normal.        Thought Content: Thought content normal.        Judgment: Judgment  normal.     Last depression screening scores    07/19/2022    9:32 AM 11/15/2020    9:32 AM 06/04/2017    8:05 AM  PHQ 2/9 Scores  PHQ - 2 Score 0 0 1  PHQ- 9 Score 0 0    Last fall risk screening    07/19/2022    9:32 AM  Fall Risk   Falls in the past year? 0  Number falls in past yr: 0  Injury with Fall? 0  Risk for fall due to : No Fall Risks  Follow up Falls evaluation completed   Last Audit-C alcohol use screening    11/15/2020    9:32 AM  Alcohol Use Disorder Test (AUDIT)  1. How often do you have a drink containing alcohol? 4  2. How many drinks containing alcohol do you have on a typical day when you are drinking? 1  3. How often do you have six or more drinks on one occasion? 0  AUDIT-C Score 5  4. How often during the last year have you found that you were not able to stop drinking once you had started? 0  5. How often during the last year have you failed to do what was normally expected from you because of drinking? 0  6. How often during the last year have you needed a first drink in the morning to get yourself going after a heavy drinking session? 0  7. How often during the last year have you had a feeling of guilt of remorse after drinking? 0  8. How often during the last year have you been unable to remember what happened the night before because you had been drinking? 0  9. Have you or someone else been injured as a result of your drinking? 0  10. Has a relative or friend or a doctor or another health worker been concerned about your drinking or suggested you cut down? 0  Alcohol Use Disorder Identification Test Final Score (AUDIT) 5   A score of 3 or more in women, and 4 or more in men indicates increased risk for alcohol abuse, EXCEPT if all of the points are from question 1   No results found for any visits on 07/19/22.  Assessment & Plan    Routine Health Maintenance and Physical Exam  Exercise Activities and Dietary recommendations  Goals    Weight  loss of 5% of pt's current weight via healthy diet and daily exercise encouraged.      Immunization History  Administered Date(s) Administered   DTaP 05/25/1996, 07/21/1996, 09/21/1996, 06/21/1997, 03/27/2000   HIB (PRP-OMP) 05/25/1996, 07/21/1996, 09/21/1996, 06/21/1997   HPV Quadrivalent 12/07/2013, 02/19/2014, 06/15/2014   Hepatitis A 03/27/2007, 01/11/2009   Hepatitis B March 18, 1996, 05/25/1996, 12/30/1996   IPV 05/25/1996, 07/21/1996, 03/22/1997, 03/27/2000   Influenza Nasal 06/15/2014   Influenza-Unspecified 07/20/2009   MMR 03/22/1997, 03/27/2000   Meningococcal B, OMV 03/01/2016   Meningococcal Conjugate 12/07/2013   Tdap 03/27/2007   Varicella 03/22/1997, 12/07/2013    Health Maintenance  Topic Date Due   COVID-19 Vaccine (1) Never done   TETANUS/TDAP  03/26/2017   INFLUENZA VACCINE  12/09/2022 (Originally 04/10/2022)   HPV VACCINES  Completed   Hepatitis C Screening  Completed   HIV Screening  Completed    Discussed health benefits of physical activity, and encouraged him to engage in regular exercise appropriate for his age and condition. Annual physical exam UTD on dental? Things to do to keep yourself healthy  - Exercise at least 30-45 minutes a day, 3-4 days a week.  - Eat a low-fat diet with lots of fruits and vegetables, up to 7-9 servings per day.  - Seatbelts can save your life. Wear them always.  - Smoke detectors on every level of your home, check batteries every year.  - Eye Doctor - have an eye exam every 1-2 years  - Safe sex - if you may be exposed  to STDs, use a condom.  - Alcohol -  If you drink, do it moderately, less than 2 drinks per day.  - [place]. Choose someone to speak for you if you are not able.  - Depression is common in our stressful world.If you're feeling down or losing interest in things you normally enjoy, please come in for a visit.  - Violence - If anyone is threatening or hurting you, please call immediately.     Elevated LFTs Per his last CMP: his LFTs were increased - Comprehensive metabolic panel - Hemoglobin A1c Healthy diet and exercise daily recommended as well as weight control Pt drinks up to 4 beers daily. Alcohol cessation advised.  His current BMI  more than 25 Will monitor  Hyperglycemia Family history of diabetes mellitus (DM) Per his last CMP: his serum glucose was elevated Has a family hx positive for DM/father's side - Comprehensive metabolic panel - Hemoglobin A1c Healthy diet and exercise daily recommended as well as weight control His current BMI  more than 25 Will monitor  Primary hypertension BP today at goal Continue amlodipine 5 mg Pt mentioned that he has been having ankle swelling sometimes Prefers to stay on his current medication - Comprehensive metabolic panel - Hemoglobin A1c Continue healthy low salt diet and start daily exercise. Will monitor  Seasonal allergies Pt was advised on symptomatic treatment: Increase fluids.   Saline nasal spray.    Humidifier in bedroom. Flonase or antihistamine nasal spray OTC  Call or return to clinic if symptoms are not improving.     FU in 1 mo   The patient was advised to call back or seek an in-person evaluation if the symptoms worsen or if the condition fails to improve as anticipated.  I discussed the assessment and treatment plan with the patient. The patient was provided an opportunity to ask questions and all were answered. The patient agreed with the plan and demonstrated an understanding of the instructions.  The entirety of the information documented in the History of Present Illness, Review of Systems and Physical Exam were personally obtained by me. Portions of this information were initially documented by the CMA and reviewed by me for thoroughness and accuracy.  Portions of this note were created using dictation software and may contain typographical errors.     Mardene Speak, PA-C  [facility] [phone] (phone) [phone] (fax)  [place]

## 2022-07-20 LAB — COMPREHENSIVE METABOLIC PANEL
ALT: 44 IU/L (ref 0–44)
AST: 28 IU/L (ref 0–40)
Albumin/Globulin Ratio: 1.9 (ref 1.2–2.2)
Albumin: 4.9 g/dL (ref 4.3–5.2)
Alkaline Phosphatase: 40 IU/L — ABNORMAL LOW (ref 44–121)
BUN/Creatinine Ratio: 10 (ref 9–20)
BUN: 9 mg/dL (ref 6–20)
Bilirubin Total: 0.3 mg/dL (ref 0.0–1.2)
CO2: 22 mmol/L (ref 20–29)
Calcium: 9.8 mg/dL (ref 8.7–10.2)
Chloride: 100 mmol/L (ref 96–106)
Creatinine, Ser: 0.9 mg/dL (ref 0.76–1.27)
Globulin, Total: 2.6 g/dL (ref 1.5–4.5)
Glucose: 103 mg/dL — ABNORMAL HIGH (ref 70–99)
Potassium: 4.4 mmol/L (ref 3.5–5.2)
Sodium: 141 mmol/L (ref 134–144)
Total Protein: 7.5 g/dL (ref 6.0–8.5)
eGFR: 121 mL/min/{1.73_m2} (ref >59)

## 2022-07-20 LAB — HEMOGLOBIN A1C
Est. average glucose Bld gHb Est-mCnc: 111 mg/dL
Hgb A1c MFr Bld: 5.5 % (ref 4.8–5.6)

## 2022-07-20 NOTE — Progress Notes (Signed)
Please, let pt know that his lab results are normal including A1C.  However, we will encourage to proceed with lifestyle modifications: healthy low carb diet and daily exercise as we discussed.

## 2022-09-13 ENCOUNTER — Other Ambulatory Visit: Payer: Self-pay | Admitting: Family Medicine

## 2022-09-13 MED ORDER — AMLODIPINE BESYLATE 5 MG PO TABS: 5.0000 mg | ORAL_TABLET | Freq: Every day | ORAL | 1 refills | Status: DC

## 2022-09-13 NOTE — Telephone Encounter (Signed)
Requested medication (s) are due for refill today: routing for review  Requested medication (s) are on the active medication list: yes  Last refill:  06/18/22  Future visit scheduled: no  Notes to clinic:  Unable to refill per protocol, last refill by another provider.      Requested Prescriptions  Pending Prescriptions Disp Refills   amLODipine (NORVASC) 5 MG tablet 30 tablet 2    Sig: Take 1 tablet (5 mg total) by mouth daily.     Cardiovascular: Calcium Channel Blockers 2 Passed - 09/13/2022  1:17 PM      Passed - Last BP in normal range    BP Readings from Last 1 Encounters:  07/19/22 114/87         Passed - Last Heart Rate in normal range    Pulse Readings from Last 1 Encounters:  07/19/22 82         Passed - Valid encounter within last 6 months    Recent Outpatient Visits           1 month ago Annual physical exam   [facility], [place], PA-C   2 months ago Primary hypertension   [facility], [place], PA-C   1 year ago Primary hypertension   [facility] Jerrol Banana., MD   1 year ago Primary hypertension   [facility] [place], Dionne Bucy, MD   1 year ago Elevated blood pressure reading   [place], Wendee Beavers, [place]

## 2022-09-13 NOTE — Telephone Encounter (Signed)
Medication Refill - Medication: amLODipine (NORVASC) 5 MG tablet [[id number]]   Has the patient contacted their pharmacy? Yes.   (Agent: If no, request that the patient contact the pharmacy for the refill. If patient does not wish to contact the pharmacy document the reason why and proceed with request.) (Agent: If yes, when and what did the pharmacy advise?)  Preferred Pharmacy (with phone number or street name): [facility] [address] [place], [place]. [address] [phone] Has the patient been seen for an appointment in the last year OR does the patient have an upcoming appointment? Yes.    Agent: Please be advised that RX refills may take up to 3 business days. We ask that you follow-up with your pharmacy.

## 2022-12-12 NOTE — Progress Notes (Deleted)
      Established patient visit   Patient: Kyle Graves   DOB: 05-28-1996   27 y.o. Male  MRN: [id number] Visit Date: 12/13/2022  Today's healthcare provider: Mardene Speak, PA-C   No chief complaint on file.  Subjective    Rectal Bleeding     ***  Medications: Outpatient Medications Prior to Visit  Medication Sig   amLODipine (NORVASC) 5 MG tablet Take 1 tablet (5 mg total) by mouth daily.   No facility-administered medications prior to visit.    Review of Systems  Gastrointestinal:  Positive for hematochezia.    {Labs  Heme  Chem  Endocrine  Serology  Results Review (optional):23779}   Objective    There were no vitals taken for this visit. {Show previous vital signs (optional):23777}  Physical Exam  ***  No results found for any visits on 12/13/22.  Assessment & Plan     ***  No follow-ups on file.      {provider attestation***:1}   Mardene Speak, PA-C  [facility] [phone] (phone) [phone] (fax)  [place]

## 2022-12-13 ENCOUNTER — Ambulatory Visit: Payer: Self-pay | Admitting: Physician Assistant

## 2023-03-06 ENCOUNTER — Encounter: Payer: Self-pay | Admitting: Internal Medicine

## 2023-03-06 ENCOUNTER — Ambulatory Visit: Payer: Medicaid Other | Admitting: Internal Medicine

## 2023-03-06 VITALS — BP 112/80 | HR 87 | Ht 70.0 in | Wt 169.2 lb

## 2023-03-06 DIAGNOSIS — Z0000 Encounter for general adult medical examination without abnormal findings: Secondary | ICD-10-CM

## 2023-03-06 DIAGNOSIS — I10 Essential (primary) hypertension: Secondary | ICD-10-CM | POA: Diagnosis not present

## 2023-03-06 DIAGNOSIS — Z1331 Encounter for screening for depression: Secondary | ICD-10-CM

## 2023-03-06 DIAGNOSIS — Z833 Family history of diabetes mellitus: Secondary | ICD-10-CM | POA: Diagnosis not present

## 2023-03-06 MED ORDER — AMLODIPINE BESYLATE 5 MG PO TABS: 5.0000 mg | ORAL_TABLET | Freq: Every day | ORAL | 1 refills | Status: DC

## 2023-03-06 NOTE — Progress Notes (Signed)
Established Patient Office Visit  Subjective:  Patient ID: Kyle Graves, male    DOB: 13-Jan-1996  Age: 27 y.o. MRN: [id number]  Chief Complaint  Patient presents with   Establish Care    New Patient    Here to transfer IM care with PMH of HTN. Concerned about his family of DM2 and requests his cholesterol checked.    No other concerns at this time.   Past Medical History:  Diagnosis Date   Hypertension     Past Surgical History:  Procedure Laterality Date   WISDOM TOOTH EXTRACTION      Social History   Socioeconomic History   Marital status: Single    Spouse name: Not on file   Number of children: Not on file   Years of education: Not on file   Highest education level: Not on file  Occupational History   Occupation: Production designer, theatre/television/film at [employer], Full time  Tobacco Use   Smoking status: Every Day    Types: E-cigarettes   Smokeless tobacco: Never  Vaping Use   Vaping Use: Every day   Substances: Nicotine, Flavoring  Substance and Sexual Activity   Alcohol use: Yes    Alcohol/week: 28.0 standard drinks of alcohol    Types: 28 Cans of beer per week    Comment: everyday, beer and liquor average 8 beers a day   Drug use: No   Sexual activity: Yes  Other Topics Concern   Not on file  Social History Narrative   Not on file   Social Determinants of Health   Financial Resource Strain: Not on file  Food Insecurity: Not on file  Transportation Needs: Not on file  Physical Activity: Not on file  Stress: Not on file  Social Connections: Not on file  Intimate Partner Violence: Not on file    Family History  Problem Relation Age of Onset   Diabetes Father    Diabetes Paternal Uncle    Hypertension Paternal Uncle    Diabetes Paternal Uncle    Hypertension Paternal Uncle    Heart disease Maternal Grandfather     No Known Allergies  Review of Systems  Constitutional:  Positive for weight loss (16 lbs). Negative for malaise/fatigue.  All other systems  reviewed and are negative.      Objective:   BP 112/80   Pulse 87   Ht 5\' 10"  (1.778 m)   Wt 169 lb 3.2 oz (76.7 kg)   SpO2 95%   BMI 24.28 kg/m   Vitals:   03/06/23 0906  BP: 112/80  Pulse: 87  Height: 5\' 10"  (1.778 m)  Weight: 169 lb 3.2 oz (76.7 kg)  SpO2: 95%  BMI (Calculated): 24.28    Physical Exam Vitals reviewed.  Constitutional:      Appearance: Normal appearance.  HENT:     Head: Normocephalic.     Left Ear: There is no impacted cerumen.     Nose: Nose normal.     Mouth/Throat:     Mouth: Mucous membranes are moist.     Pharynx: No posterior oropharyngeal erythema.  Eyes:     Extraocular Movements: Extraocular movements intact.     Pupils: Pupils are equal, round, and reactive to light.  Cardiovascular:     Rate and Rhythm: Regular rhythm.     Chest Wall: PMI is not displaced.     Pulses: Normal pulses.     Heart sounds: Normal heart sounds. No murmur heard. Pulmonary:     Effort: Pulmonary  effort is normal.     Breath sounds: Normal air entry. No rhonchi or rales.  Abdominal:     General: Abdomen is flat. Bowel sounds are normal. There is no distension.     Palpations: Abdomen is soft. There is no hepatomegaly, splenomegaly or mass.     Tenderness: There is no abdominal tenderness.  Musculoskeletal:        General: Normal range of motion.     Cervical back: Normal range of motion and neck supple.     Right lower leg: No edema.     Left lower leg: No edema.  Skin:    General: Skin is warm and dry.  Neurological:     General: No focal deficit present.     Mental Status: He is alert and oriented to person, place, and time.     Cranial Nerves: No cranial nerve deficit.     Motor: No weakness.  Psychiatric:        Mood and Affect: Mood normal.        Behavior: Behavior normal.      No results found for any visits on 03/06/23.  No results found for this or any previous visit (from the past 2160 hour([place])).    Assessment & Plan:  As per  problem list  Problem List Items Addressed This Visit       Cardiovascular and Mediastinum   Primary hypertension - Primary   Relevant Medications   amLODipine (NORVASC) 5 MG tablet   Other Relevant Orders   CBC With Diff/Platelet   Comprehensive metabolic panel     Other   Annual physical exam   Relevant Orders   Lipid panel   Family history of diabetes mellitus (DM)   Relevant Orders   Hemoglobin A1c    Return in about 2 weeks (around 03/20/2023) for cpe.   Total time spent: 35 minutes  Luna Fuse, MD  03/06/2023   This document may have been prepared by [facility] Voice Recognition software and as such may include unintentional dictation errors.

## 2023-03-07 ENCOUNTER — Encounter: Payer: Self-pay | Admitting: Internal Medicine

## 2023-03-07 LAB — CBC WITH DIFF/PLATELET
Basophils Absolute: 0 10*3/uL (ref 0.0–0.2)
Basos: 1 %
EOS (ABSOLUTE): 0.2 10*3/uL (ref 0.0–0.4)
Eos: 3 %
Hematocrit: 51.8 % — ABNORMAL HIGH (ref 37.5–51.0)
Hemoglobin: 17.1 g/dL (ref 13.0–17.7)
Immature Grans (Abs): 0 10*3/uL (ref 0.0–0.1)
Immature Granulocytes: 0 %
Lymphocytes Absolute: 2.4 10*3/uL (ref 0.7–3.1)
Lymphs: 47 %
MCH: 29.8 pg (ref 26.6–33.0)
MCHC: 33 g/dL (ref 31.5–35.7)
MCV: 90 fL (ref 79–97)
Monocytes Absolute: 0.4 10*3/uL (ref 0.1–0.9)
Monocytes: 8 %
Neutrophils Absolute: 2.1 10*3/uL (ref 1.4–7.0)
Neutrophils: 41 %
Platelets: 255 10*3/uL (ref 150–450)
RBC: 5.73 x10E6/uL (ref 4.14–5.80)
RDW: 13 % (ref 11.6–15.4)
WBC: 5.2 10*3/uL (ref 3.4–10.8)

## 2023-03-07 LAB — COMPREHENSIVE METABOLIC PANEL
ALT: 23 IU/L (ref 0–44)
AST: 19 IU/L (ref 0–40)
Albumin: 4.9 g/dL (ref 4.3–5.2)
Alkaline Phosphatase: 37 IU/L — ABNORMAL LOW (ref 44–121)
BUN/Creatinine Ratio: 7 — ABNORMAL LOW (ref 9–20)
BUN: 7 mg/dL (ref 6–20)
Bilirubin Total: 0.5 mg/dL (ref 0.0–1.2)
CO2: 23 mmol/L (ref 20–29)
Calcium: 9.9 mg/dL (ref 8.7–10.2)
Chloride: 100 mmol/L (ref 96–106)
Creatinine, Ser: 0.94 mg/dL (ref 0.76–1.27)
Globulin, Total: 2.4 g/dL (ref 1.5–4.5)
Glucose: 78 mg/dL (ref 70–99)
Potassium: 4.4 mmol/L (ref 3.5–5.2)
Sodium: 141 mmol/L (ref 134–144)
Total Protein: 7.3 g/dL (ref 6.0–8.5)
eGFR: 115 mL/min/{1.73_m2} (ref >59)

## 2023-03-07 LAB — LIPID PANEL
Chol/HDL Ratio: 3 ratio (ref 0.0–5.0)
Cholesterol, Total: 189 mg/dL (ref 100–199)
HDL: 63 mg/dL (ref >39)
LDL Chol Calc (NIH): 105 mg/dL — ABNORMAL HIGH (ref 0–99)
Triglycerides: 121 mg/dL (ref 0–149)
VLDL Cholesterol Cal: 21 mg/dL (ref 5–40)

## 2023-03-07 LAB — HEMOGLOBIN A1C
Est. average glucose Bld gHb Est-mCnc: 108 mg/dL
Hgb A1c MFr Bld: 5.4 % (ref 4.8–5.6)

## 2023-03-27 ENCOUNTER — Encounter: Payer: Medicaid Other | Admitting: Internal Medicine

## 2023-05-10 ENCOUNTER — Ambulatory Visit: Payer: Medicaid Other | Admitting: Family

## 2023-05-10 DIAGNOSIS — Z0000 Encounter for general adult medical examination without abnormal findings: Secondary | ICD-10-CM

## 2023-05-12 ENCOUNTER — Encounter: Payer: Self-pay | Admitting: Family

## 2023-05-12 NOTE — Progress Notes (Signed)
Complete physical exam  Patient: Kyle Graves   DOB: [birth year]   27 y.o. Male  MRN: [id number]  Subjective:    Chief Complaint  Patient presents with   Annual Exam    Joseeduardo N Gresham is a 27 y.o. male who presents today for a complete physical exam. He reports consuming a general diet.  He generally feels well. He reports sleeping well. He does not have additional problems to discuss today.    Most recent fall risk assessment:    07/19/2022    9:32 AM  Fall Risk   Falls in the past year? 0  Number falls in past yr: 0  Injury with Fall? 0  Risk for fall due to : No Fall Risks  Follow up Falls evaluation completed     Most recent depression screenings:    03/06/2023    4:11 PM 07/19/2022    9:32 AM  PHQ 2/9 Scores  PHQ - 2 Score 0 0  PHQ- 9 Score 0 0   Past Medical History:  Diagnosis Date   Hypertension     Past Surgical History:  Procedure Laterality Date   WISDOM TOOTH EXTRACTION      Family History  Problem Relation Age of Onset   Diabetes Father    Diabetes Paternal Uncle    Hypertension Paternal Uncle    Diabetes Paternal Uncle    Hypertension Paternal Uncle    Heart disease Maternal Grandfather     Social History   Socioeconomic History   Marital status: Single    Spouse name: Not on file   Number of children: Not on file   Years of education: Not on file   Highest education level: Not on file  Occupational History   Occupation: Production designer, theatre/television/film at [employer], Full time  Tobacco Use   Smoking status: Every Day    Types: E-cigarettes   Smokeless tobacco: Never  Vaping Use   Vaping status: Every Day   Substances: Nicotine, Flavoring  Substance and Sexual Activity   Alcohol use: Yes    Alcohol/week: 28.0 standard drinks of alcohol    Types: 28 Cans of beer per week    Comment: everyday, beer and liquor average 8 beers a day   Drug use: Not Currently    Types: Cocaine   Sexual activity: Yes  Other Topics Concern   Not on file   Social History Narrative   Not on file   Social Determinants of Health   Financial Resource Strain: Not on file  Food Insecurity: Not on file  Transportation Needs: Not on file  Physical Activity: Not on file  Stress: Not on file  Social Connections: Not on file  Intimate Partner Violence: Not on file    Outpatient Medications Prior to Visit  Medication Sig   amLODipine (NORVASC) 5 MG tablet Take 1 tablet (5 mg total) by mouth daily.   doxylamine, Sleep, (UNISOM) 25 MG tablet Take 25 mg by mouth at bedtime as needed for sleep.   No facility-administered medications prior to visit.    Review of Systems  All other systems reviewed and are negative.       Objective:     BP 118/62   Pulse 76   Ht 5\' 10"  (1.778 m)   Wt 171 lb 9.6 oz (77.8 kg)   SpO2 98%   BMI 24.62 kg/m   Physical Exam Vitals and nursing note reviewed.  Constitutional:      General: He is awake.  Appearance: Normal appearance. He is well-developed and normal weight.  HENT:     Head: Normocephalic and atraumatic.     Right Ear: Tympanic membrane, ear canal and external ear normal.     Left Ear: Tympanic membrane, ear canal and external ear normal.     Nose: Nose normal.  Eyes:     Extraocular Movements: Extraocular movements intact.     Conjunctiva/sclera: Conjunctivae normal.     Pupils: Pupils are equal, round, and reactive to light.  Cardiovascular:     Rate and Rhythm: Normal rate and regular rhythm.     Pulses: Normal pulses.     Heart sounds: Normal heart sounds.  Pulmonary:     Effort: Pulmonary effort is normal.     Breath sounds: Normal breath sounds.  Abdominal:     General: Abdomen is flat. Bowel sounds are normal.  Musculoskeletal:        General: Normal range of motion.     Cervical back: Normal range of motion.  Skin:    General: Skin is warm and dry.     Capillary Refill: Capillary refill takes less than 2 seconds.  Neurological:     General: No focal deficit present.      Mental Status: He is alert and oriented to person, place, and time. Mental status is at baseline.  Psychiatric:        Mood and Affect: Mood normal.        Behavior: Behavior normal. Behavior is cooperative.        Thought Content: Thought content normal.        Judgment: Judgment normal.      No results found for any visits on 05/10/23.  Recent Results (from the past 2160 hour(s))  CBC With Diff/Platelet     Status: Abnormal   Collection Time: 03/06/23  9:38 AM  Result Value Ref Range   WBC 5.2 3.4 - 10.8 x10E3/uL   RBC 5.73 4.14 - 5.80 x10E6/uL   Hemoglobin 17.1 13.0 - 17.7 g/dL   Hematocrit 16.1 (H) 09.6 - 51.0 %   MCV 90 79 - 97 fL   MCH 29.8 26.6 - 33.0 pg   MCHC 33.0 31.5 - 35.7 g/dL   RDW 04.5 40.9 - 81.1 %   Platelets 255 150 - 450 x10E3/uL   Neutrophils 41 Not Estab. %   Lymphs 47 Not Estab. %   Monocytes 8 Not Estab. %   Eos 3 Not Estab. %   Basos 1 Not Estab. %   Neutrophils Absolute 2.1 1.4 - 7.0 x10E3/uL   Lymphocytes Absolute 2.4 0.7 - 3.1 x10E3/uL   Monocytes Absolute 0.4 0.1 - 0.9 x10E3/uL   EOS (ABSOLUTE) 0.2 0.0 - 0.4 x10E3/uL   Basophils Absolute 0.0 0.0 - 0.2 x10E3/uL   Immature Granulocytes 0 Not Estab. %   Immature Grans (Abs) 0.0 0.0 - 0.1 x10E3/uL  Comprehensive metabolic panel     Status: Abnormal   Collection Time: 03/06/23  9:38 AM  Result Value Ref Range   Glucose 78 70 - 99 mg/dL   BUN 7 6 - 20 mg/dL   Creatinine, Ser 9.14 0.76 - 1.27 mg/dL   eGFR 782 >95 AO/ZHY/8.65   BUN/Creatinine Ratio 7 (L) 9 - 20   Sodium 141 134 - 144 mmol/L   Potassium 4.4 3.5 - 5.2 mmol/L   Chloride 100 96 - 106 mmol/L   CO2 23 20 - 29 mmol/L   Calcium 9.9 8.7 - 10.2 mg/dL   Total  Protein 7.3 6.0 - 8.5 g/dL   Albumin 4.9 4.3 - 5.2 g/dL   Globulin, Total 2.4 1.5 - 4.5 g/dL   Bilirubin Total 0.5 0.0 - 1.2 mg/dL   Alkaline Phosphatase 37 (L) 44 - 121 IU/L   AST 19 0 - 40 IU/L   ALT 23 0 - 44 IU/L  Hemoglobin A1c     Status: None   Collection Time:  03/06/23  9:38 AM  Result Value Ref Range   Hgb A1c MFr Bld 5.4 4.8 - 5.6 %    Comment:          Prediabetes: 5.7 - 6.4          Diabetes: >6.4          Glycemic control for adults with diabetes: <7.0    Est. average glucose Bld gHb Est-mCnc 108 mg/dL  Lipid panel     Status: Abnormal   Collection Time: 03/06/23  9:38 AM  Result Value Ref Range   Cholesterol, Total 189 100 - 199 mg/dL   Triglycerides 387 0 - 149 mg/dL   HDL 63 >56 mg/dL   VLDL Cholesterol Cal 21 5 - 40 mg/dL   LDL Chol Calc (NIH) 433 (H) 0 - 99 mg/dL   Chol/HDL Ratio 3.0 0.0 - 5.0 ratio    Comment:                                   T. Chol/HDL Ratio                                             Men  Women                               1/2 Avg.Risk  3.4    3.3                                   Avg.Risk  5.0    4.4                                2X Avg.Risk  9.6    7.1                                3X Avg.Risk 23.4   11.0         Assessment & Plan:    Routine Health Maintenance and Physical Exam  Immunization History  Administered Date(s) Administered   DTaP 05/25/1996, 07/21/1996, 09/21/1996, 06/21/1997, 03/27/2000   HIB (PRP-OMP) 05/25/1996, 07/21/1996, 09/21/1996, 06/21/1997   HPV Quadrivalent 12/07/2013, 02/19/2014, 06/15/2014   Hepatitis A 03/27/2007, 01/11/2009   Hepatitis B 01-16-96, 05/25/1996, 12/30/1996   IPV 05/25/1996, 07/21/1996, 03/22/1997, 03/27/2000   Influenza Nasal 06/15/2014   Influenza-Unspecified 07/20/2009   MMR 03/22/1997, 03/27/2000   Meningococcal B, OMV 03/01/2016   Meningococcal Conjugate 12/07/2013   Tdap 03/27/2007   Varicella 03/22/1997, 12/07/2013    Health Maintenance  Topic Date Due   DTaP/Tdap/Td (7 - Td or Tdap) 03/26/2017   INFLUENZA VACCINE  04/11/2023   COVID-19 Vaccine (1 -  2023-24 season) Never done   HPV VACCINES  Completed   Hepatitis C Screening  Completed   HIV Screening  Completed    Discussed health benefits of physical activity, and encouraged  him to engage in regular exercise appropriate for his age and condition.  Problem List Items Addressed This Visit       Active Problems   Annual physical exam - Primary   Return in about 4 months (around 09/09/2023) for F/U.     Miki Kins, FNP  05/10/2023   This document may have been prepared by [facility] Voice Recognition software and as such may include unintentional dictation errors.

## 2023-06-13 ENCOUNTER — Ambulatory Visit: Payer: Medicaid Other | Admitting: Physician Assistant

## 2023-06-13 DIAGNOSIS — E663 Overweight: Secondary | ICD-10-CM

## 2023-06-13 DIAGNOSIS — R739 Hyperglycemia, unspecified: Secondary | ICD-10-CM

## 2023-06-13 DIAGNOSIS — K649 Unspecified hemorrhoids: Secondary | ICD-10-CM

## 2023-06-13 DIAGNOSIS — F1010 Alcohol abuse, uncomplicated: Secondary | ICD-10-CM

## 2023-06-13 DIAGNOSIS — K5909 Other constipation: Secondary | ICD-10-CM

## 2023-08-19 ENCOUNTER — Ambulatory Visit: Payer: Medicaid Other | Admitting: Internal Medicine

## 2023-08-19 ENCOUNTER — Encounter: Payer: Self-pay | Admitting: Internal Medicine

## 2023-08-19 VITALS — BP 120/88 | HR 79 | Ht 70.0 in | Wt 173.0 lb

## 2023-08-19 DIAGNOSIS — R002 Palpitations: Secondary | ICD-10-CM | POA: Diagnosis not present

## 2023-08-19 DIAGNOSIS — K649 Unspecified hemorrhoids: Secondary | ICD-10-CM | POA: Diagnosis not present

## 2023-08-19 DIAGNOSIS — Z0130 Encounter for examination of blood pressure without abnormal findings: Secondary | ICD-10-CM

## 2023-08-19 MED ORDER — HYDROCORTISONE ACETATE 25 MG RE SUPP: 25.0000 mg | Freq: Two times a day (BID) | RECTAL | 0 refills | Status: DC

## 2023-08-19 NOTE — Progress Notes (Signed)
Established Patient Office Visit  Subjective:  Patient ID: Kyle Graves, male    DOB: 06/19/1996  Age: 27 y.o. MRN: [id number]  Chief Complaint  Patient presents with   Hemorrhoids    Bleeding hemorrhoids    C/o bleeding hemorrhoids.  Also c/o intermittent palpitations and sweating which wakes him up at night weekly with tingling of his left arm. Denies known ingestion of phenylephrine and doesn't take any otc supplements.     No other concerns at this time.   Past Medical History:  Diagnosis Date   Hypertension     Past Surgical History:  Procedure Laterality Date   WISDOM TOOTH EXTRACTION      Social History   Socioeconomic History   Marital status: Single    Spouse name: Not on file   Number of children: Not on file   Years of education: Not on file   Highest education level: 12th grade  Occupational History   Occupation: Production designer, theatre/television/film at [employer], Full time  Tobacco Use   Smoking status: Every Day    Types: E-cigarettes   Smokeless tobacco: Never  Vaping Use   Vaping status: Every Day   Substances: Nicotine, Flavoring  Substance and Sexual Activity   Alcohol use: Yes    Alcohol/week: 28.0 standard drinks of alcohol    Types: 28 Cans of beer per week    Comment: everyday, beer and liquor average 8 beers a day   Drug use: Not Currently    Types: Cocaine   Sexual activity: Yes  Other Topics Concern   Not on file  Social History Narrative   Not on file   Social Determinants of Health   Financial Resource Strain: Low Risk  (06/12/2023)   Overall Financial Resource Strain (CARDIA)    Difficulty of Paying Living Expenses: Not hard at all  Food Insecurity: No Food Insecurity (06/12/2023)   Hunger Vital Sign    Worried About Running Out of Food in the Last Year: Never true    Ran Out of Food in the Last Year: Never true  Transportation Needs: No Transportation Needs (06/12/2023)   PRAPARE - Administrator, Civil Service (Medical): No    Lack  of Transportation (Non-Medical): No  Physical Activity: Insufficiently Active (06/12/2023)   Exercise Vital Sign    Days of Exercise per Week: 1 day    Minutes of Exercise per Session: 20 min  Stress: No Stress Concern Present (06/12/2023)   [employer] of Occupational Health - Occupational Stress Questionnaire    Feeling of Stress : Not at all  Social Connections: Unknown (06/12/2023)   Social Connection and Isolation Panel [NHANES]    Frequency of Communication with Friends and Family: Not on file    Frequency of Social Gatherings with Friends and Family: Not on file    Attends Religious Services: Not on file    Active Member of Clubs or Organizations: No    Attends Engineer, structural: Not on file    Marital Status: Not on file  Intimate Partner Violence: Not on file    Family History  Problem Relation Age of Onset   Diabetes Father    Diabetes Paternal Uncle    Hypertension Paternal Uncle    Diabetes Paternal Uncle    Hypertension Paternal Uncle    Heart disease Maternal Grandfather     No Known Allergies  Outpatient Medications Prior to Visit  Medication Sig   amLODipine (NORVASC) 5 MG tablet Take 1 tablet (  5 mg total) by mouth daily.   doxylamine, Sleep, (UNISOM) 25 MG tablet Take 25 mg by mouth at bedtime as needed for sleep.   No facility-administered medications prior to visit.    Review of Systems  All other systems reviewed and are negative.      Objective:   BP 120/88   Pulse 79   Ht 5\' 10"  (1.778 m)   Wt 173 lb (78.5 kg)   SpO2 97%   BMI 24.82 kg/m   Vitals:   08/19/23 1025  BP: 120/88  Pulse: 79  Height: 5\' 10"  (1.778 m)  Weight: 173 lb (78.5 kg)  SpO2: 97%  BMI (Calculated): 24.82    Physical Exam Vitals reviewed.  Constitutional:      Appearance: Normal appearance.  HENT:     Head: Normocephalic.     Left Ear: There is no impacted cerumen.     Nose: Nose normal.     Mouth/Throat:     Mouth: Mucous membranes are  moist.     Pharynx: No posterior oropharyngeal erythema.  Eyes:     Extraocular Movements: Extraocular movements intact.     Pupils: Pupils are equal, round, and reactive to light.  Cardiovascular:     Rate and Rhythm: Regular rhythm.     Chest Wall: PMI is not displaced.     Pulses: Normal pulses.     Heart sounds: Normal heart sounds. No murmur heard. Pulmonary:     Effort: Pulmonary effort is normal.     Breath sounds: Normal air entry. No rhonchi or rales.  Abdominal:     General: Abdomen is flat. Bowel sounds are normal. There is no distension.     Palpations: Abdomen is soft. There is no hepatomegaly, splenomegaly or mass.     Tenderness: There is no abdominal tenderness.  Musculoskeletal:        General: Normal range of motion.     Cervical back: Normal range of motion and neck supple.     Right lower leg: No edema.     Left lower leg: No edema.  Skin:    General: Skin is warm and dry.  Neurological:     General: No focal deficit present.     Mental Status: He is alert and oriented to person, place, and time.     Cranial Nerves: No cranial nerve deficit.     Motor: No weakness.  Psychiatric:        Mood and Affect: Mood normal.        Behavior: Behavior normal.      No results found for any visits on 08/19/23.  No results found for this or any previous visit (from the past 2160 hour(Shealeigh Dunstan)).    Assessment & Plan:  As per problem list  Problem List Items Addressed This Visit   None Visit Diagnoses     Bleeding hemorrhoids    -  Primary   Relevant Medications   hydrocortisone (ANUSOL-HC) 25 MG suppository   Other Relevant Orders   CBC With Diff/Platelet   Palpitations       Relevant Orders   Ambulatory referral to Cardiology   Catecholamines, Fractionated, Plasma   Catecholamines, fractionated, urine, 24 hour   Catecholamines,Ur.,Free,24 Hh       Return in about 3 weeks (around 09/09/2023) for lab results, hemorrhoid and palpitations fu.   Total time  spent: 30 minutes  Luna Fuse, MD  08/19/2023   This document may have been prepared by [employer] [facility] Voice  Recognition software and as such may include unintentional dictation errors.

## 2023-08-22 LAB — CBC WITH DIFF/PLATELET
Basophils Absolute: 0 10*3/uL (ref 0.0–0.2)
Basos: 1 %
EOS (ABSOLUTE): 0.2 10*3/uL (ref 0.0–0.4)
Eos: 5 %
Hematocrit: 44 % (ref 37.5–51.0)
Hemoglobin: 14.9 g/dL (ref 13.0–17.7)
Immature Grans (Abs): 0 10*3/uL (ref 0.0–0.1)
Immature Granulocytes: 0 %
Lymphocytes Absolute: 1.7 10*3/uL (ref 0.7–3.1)
Lymphs: 40 %
MCH: 29.7 pg (ref 26.6–33.0)
MCHC: 33.9 g/dL (ref 31.5–35.7)
MCV: 88 fL (ref 79–97)
Monocytes Absolute: 0.4 10*3/uL (ref 0.1–0.9)
Monocytes: 8 %
Neutrophils Absolute: 1.9 10*3/uL (ref 1.4–7.0)
Neutrophils: 46 %
Platelets: 265 10*3/uL (ref 150–450)
RBC: 5.01 x10E6/uL (ref 4.14–5.80)
RDW: 12.5 % (ref 11.6–15.4)
WBC: 4.2 10*3/uL (ref 3.4–10.8)

## 2023-08-22 LAB — CATECHOLAMINES, FRACTIONATED, PLASMA
Dopamine: <30 pg/mL (ref 0–48)
Epinephrine: <15 pg/mL (ref 0–62)
Norepinephrine: 433 pg/mL (ref 0–874)

## 2023-08-23 ENCOUNTER — Encounter: Payer: Self-pay | Admitting: Cardiovascular Disease

## 2023-08-23 ENCOUNTER — Ambulatory Visit: Payer: Medicaid Other | Admitting: Cardiovascular Disease

## 2023-08-23 VITALS — BP 119/81 | HR 79 | Ht 70.0 in | Wt 171.6 lb

## 2023-08-23 DIAGNOSIS — R0602 Shortness of breath: Secondary | ICD-10-CM

## 2023-08-23 DIAGNOSIS — R002 Palpitations: Secondary | ICD-10-CM

## 2023-08-23 DIAGNOSIS — I10 Essential (primary) hypertension: Secondary | ICD-10-CM

## 2023-08-23 DIAGNOSIS — R0789 Other chest pain: Secondary | ICD-10-CM

## 2023-08-23 DIAGNOSIS — Z833 Family history of diabetes mellitus: Secondary | ICD-10-CM

## 2023-08-23 DIAGNOSIS — F17210 Nicotine dependence, cigarettes, uncomplicated: Secondary | ICD-10-CM

## 2023-08-23 NOTE — Progress Notes (Signed)
Cardiology Office Note   Date:  08/23/2023   ID:  Kyle Graves, DOB Mar 03, 1996, MRN [id number]  PCP:  Kyle Monday, MD  Cardiologist:  Adrian Blackwater, MD      History of Present Illness: Kyle Graves is a 27 y.o. male who presents for No chief complaint on file.   I was referred this patient because of chest pain and palpitation.  Patient states he wakes up wake up at night due to palpitation,chest pain with sweating and SOBand tingling in arms.  Chest Pain  This is a new problem. The current episode started 1 to 4 weeks ago. The onset quality is sudden. The problem has been waxing and waning. The pain is at a severity of 5/10. The pain is moderate. The quality of the pain is described as heavy.      Past Medical History:  Diagnosis Date   Hypertension      Past Surgical History:  Procedure Laterality Date   WISDOM TOOTH EXTRACTION       Current Outpatient Medications  Medication Sig Dispense Refill   amLODipine (NORVASC) 5 MG tablet Take 1 tablet (5 mg total) by mouth daily. 90 tablet 1   doxylamine, Sleep, (UNISOM) 25 MG tablet Take 25 mg by mouth at bedtime as needed for sleep.     hydrocortisone (ANUSOL-HC) 25 MG suppository Place 1 suppository (25 mg total) rectally 2 (two) times daily. 12 suppository 0   No current facility-administered medications for this visit.    Allergies:   Patient has no known allergies.    Social History:   reports that he has been smoking e-cigarettes. He has never used smokeless tobacco. He reports current alcohol use of about 28.0 standard drinks of alcohol per week. He reports that he does not currently use drugs after having used the following drugs: Cocaine.   Family History:  family history includes Diabetes in his father, paternal uncle, and paternal uncle; Heart disease in his maternal grandfather; Hypertension in his paternal uncle and paternal uncle.    ROS:     Review of Systems   Constitutional: Negative.   HENT: Negative.    Eyes: Negative.   Respiratory: Negative.    Cardiovascular:  Positive for chest pain.  Gastrointestinal: Negative.   Genitourinary: Negative.   Musculoskeletal: Negative.   Skin: Negative.   Neurological: Negative.   Endo/Heme/Allergies: Negative.   Psychiatric/Behavioral: Negative.    All other systems reviewed and are negative.     All other systems are reviewed and negative.    PHYSICAL EXAM: VS:  BP 119/81   Pulse 79   Ht 5\' 10"  (1.778 m)   Wt 171 lb 9.6 oz (77.8 kg)   SpO2 99%   BMI 24.62 kg/m  , BMI Body mass index is 24.62 kg/m. Last weight:  Wt Readings from Last 3 Encounters:  08/23/23 171 lb 9.6 oz (77.8 kg)  08/19/23 173 lb (78.5 kg)  05/10/23 171 lb 9.6 oz (77.8 kg)     Physical Exam Vitals reviewed.  Constitutional:      Appearance: Normal appearance. He is normal weight.  HENT:     Head: Normocephalic.     Nose: Nose normal.     Mouth/Throat:     Mouth: Mucous membranes are moist.  Eyes:     Pupils: Pupils are equal, round, and reactive to light.  Cardiovascular:     Rate and Rhythm: Normal rate and regular rhythm.     Pulses: Normal  pulses.     Heart sounds: Normal heart sounds.  Pulmonary:     Effort: Pulmonary effort is normal.  Abdominal:     General: Abdomen is flat. Bowel sounds are normal.  Musculoskeletal:        General: Normal range of motion.     Cervical back: Normal range of motion.  Skin:    General: Skin is warm.  Neurological:     General: No focal deficit present.     Mental Status: He is alert.  Psychiatric:        Mood and Affect: Mood normal.       EKG: NSR 69/min LVH, early repolarization abnormality  Recent Labs: 03/06/2023: ALT 23; BUN 7; Creatinine, Ser 0.94; Potassium 4.4; Sodium 141 08/19/2023: Hemoglobin 14.9; Platelets 265    Lipid Panel    Component Value Date/Time   CHOL 189 03/06/2023 0938   TRIG 121 03/06/2023 0938   HDL 63 03/06/2023 0938    CHOLHDL 3.0 03/06/2023 0938   LDLCALC 105 (H) 03/06/2023 0938      Other studies Reviewed: Additional studies/ records that were reviewed today include:  Review of the above records demonstrates:       No data to display            ASSESSMENT AND PLAN:    ICD-10-CM   1. Palpitations  R00.2 PCV ECHOCARDIOGRAM COMPLETE    MYOCARDIAL PERFUSION IMAGING   Patient keeps having chest pain and diaphoresis with tingling sensation in the left arm.  He he wakes up with palpitation.    2. Primary hypertension  I10 PCV ECHOCARDIOGRAM COMPLETE    MYOCARDIAL PERFUSION IMAGING    3. Family history of diabetes mellitus (DM)  Z83.3 PCV ECHOCARDIOGRAM COMPLETE    MYOCARDIAL PERFUSION IMAGING    4. SOB (shortness of breath)  R06.02 PCV ECHOCARDIOGRAM COMPLETE    MYOCARDIAL PERFUSION IMAGING    5. Other chest pain  R07.89 PCV ECHOCARDIOGRAM COMPLETE    MYOCARDIAL PERFUSION IMAGING   Patient has tightness in the chest associated with palpitation and diaphoresis .  EKG has nonspecific ST-T changes.  Advise echo and a stress test       Problem List Items Addressed This Visit       Cardiovascular and Mediastinum   Primary hypertension   Relevant Orders   PCV ECHOCARDIOGRAM COMPLETE   MYOCARDIAL PERFUSION IMAGING     Other   Chest pain   Relevant Orders   PCV ECHOCARDIOGRAM COMPLETE   MYOCARDIAL PERFUSION IMAGING   Family history of diabetes mellitus (DM)   Relevant Orders   PCV ECHOCARDIOGRAM COMPLETE   MYOCARDIAL PERFUSION IMAGING   Palpitations - Primary   Relevant Orders   PCV ECHOCARDIOGRAM COMPLETE   MYOCARDIAL PERFUSION IMAGING   Other Visit Diagnoses       SOB (shortness of breath)       Relevant Orders   PCV ECHOCARDIOGRAM COMPLETE   MYOCARDIAL PERFUSION IMAGING          Disposition:   Return in about 1 week (around 08/30/2023) for echo, stress test and f/u.    Total time spent: 50 minutes  Signed,  Adrian Blackwater, MD  08/23/2023 9:54 AM    [facility]

## 2023-08-26 ENCOUNTER — Encounter: Payer: Self-pay | Admitting: Cardiovascular Disease

## 2023-08-26 ENCOUNTER — Other Ambulatory Visit: Payer: Self-pay | Admitting: Internal Medicine

## 2023-08-26 DIAGNOSIS — I10 Essential (primary) hypertension: Secondary | ICD-10-CM

## 2023-09-06 ENCOUNTER — Ambulatory Visit (INDEPENDENT_AMBULATORY_CARE_PROVIDER_SITE_OTHER): Payer: Medicaid Other

## 2023-09-06 DIAGNOSIS — R002 Palpitations: Secondary | ICD-10-CM

## 2023-09-06 DIAGNOSIS — I371 Nonrheumatic pulmonary valve insufficiency: Secondary | ICD-10-CM

## 2023-09-06 DIAGNOSIS — I10 Essential (primary) hypertension: Secondary | ICD-10-CM

## 2023-09-06 DIAGNOSIS — I361 Nonrheumatic tricuspid (valve) insufficiency: Secondary | ICD-10-CM

## 2023-09-06 DIAGNOSIS — R0602 Shortness of breath: Secondary | ICD-10-CM

## 2023-09-06 DIAGNOSIS — R0789 Other chest pain: Secondary | ICD-10-CM

## 2023-09-06 DIAGNOSIS — Z833 Family history of diabetes mellitus: Secondary | ICD-10-CM

## 2023-09-09 ENCOUNTER — Ambulatory Visit: Payer: Medicaid Other | Admitting: Internal Medicine

## 2023-09-09 ENCOUNTER — Encounter: Payer: Self-pay | Admitting: Internal Medicine

## 2023-09-09 VITALS — BP 114/70 | HR 70 | Ht 70.0 in | Wt 170.0 lb

## 2023-09-09 DIAGNOSIS — Z0130 Encounter for examination of blood pressure without abnormal findings: Secondary | ICD-10-CM

## 2023-09-09 DIAGNOSIS — R002 Palpitations: Secondary | ICD-10-CM

## 2023-09-09 DIAGNOSIS — E782 Mixed hyperlipidemia: Secondary | ICD-10-CM | POA: Diagnosis not present

## 2023-09-09 NOTE — Addendum Note (Signed)
Addended by: Aundria Mems AHMAD on: 09/09/2023 03:49 PM   Modules accepted: Orders

## 2023-09-09 NOTE — Progress Notes (Signed)
Established Patient Office Visit  Subjective:  Patient ID: Kyle Graves, male    DOB: 1995/09/15  Age: 27 y.o. MRN: [id number]  Chief Complaint  Patient presents with   Follow-up    3 Weeks Follow Up    No new complaints, here for lab review and medication refills. Catecholamines were normal but still c/o palpitations. Currently undergoing cardiac work up.    No other concerns at this time.   Past Medical History:  Diagnosis Date   Hypertension     Past Surgical History:  Procedure Laterality Date   WISDOM TOOTH EXTRACTION      Social History   Socioeconomic History   Marital status: Single    Spouse name: Not on file   Number of children: Not on file   Years of education: Not on file   Highest education level: 12th grade  Occupational History   Occupation: Production designer, theatre/television/film at [employer], Full time  Tobacco Use   Smoking status: Former    Types: E-cigarettes   Smokeless tobacco: Never  Vaping Use   Vaping status: Former   Substances: Nicotine, Flavoring  Substance and Sexual Activity   Alcohol use: Yes    Alcohol/week: 28.0 standard drinks of alcohol    Types: 28 Cans of beer per week    Comment: everyday, beer and liquor average 8 beers a day   Drug use: Not Currently    Types: Cocaine   Sexual activity: Yes  Other Topics Concern   Not on file  Social History Narrative   Not on file   Social Drivers of Health   Financial Resource Strain: Low Risk  (06/12/2023)   Overall Financial Resource Strain (CARDIA)    Difficulty of Paying Living Expenses: Not hard at all  Food Insecurity: No Food Insecurity (06/12/2023)   Hunger Vital Sign    Worried About Running Out of Food in the Last Year: Never true    Ran Out of Food in the Last Year: Never true  Transportation Needs: No Transportation Needs (06/12/2023)   PRAPARE - Administrator, Civil Service (Medical): No    Lack of Transportation (Non-Medical): No  Physical Activity: Insufficiently Active  (06/12/2023)   Exercise Vital Sign    Days of Exercise per Week: 1 day    Minutes of Exercise per Session: 20 min  Stress: No Stress Concern Present (06/12/2023)   [employer] of Occupational Health - Occupational Stress Questionnaire    Feeling of Stress : Not at all  Social Connections: Unknown (06/12/2023)   Social Connection and Isolation Panel [NHANES]    Frequency of Communication with Friends and Family: Not on file    Frequency of Social Gatherings with Friends and Family: Not on file    Attends Religious Services: Not on file    Active Member of Clubs or Organizations: No    Attends Banker Meetings: Not on file    Marital Status: Not on file  Intimate Partner Violence: Not on file    Family History  Problem Relation Age of Onset   Diabetes Father    Diabetes Paternal Uncle    Hypertension Paternal Uncle    Diabetes Paternal Uncle    Hypertension Paternal Uncle    Heart disease Maternal Grandfather     No Known Allergies  Outpatient Medications Prior to Visit  Medication Sig   amLODipine (NORVASC) 5 MG tablet TAKE 1 TABLET (5 MG TOTAL) BY MOUTH DAILY.   doxylamine, Sleep, (UNISOM) 25 MG  tablet Take 25 mg by mouth at bedtime as needed for sleep.   hydrocortisone (ANUSOL-HC) 25 MG suppository Place 1 suppository (25 mg total) rectally 2 (two) times daily. (Patient not taking: Reported on 09/09/2023)   No facility-administered medications prior to visit.    Review of Systems  All other systems reviewed and are negative.      Objective:   BP 114/70   Pulse 70   Ht 5\' 10"  (1.778 m)   Wt 170 lb (77.1 kg)   SpO2 98%   BMI 24.39 kg/m   Vitals:   09/09/23 1001  BP: 114/70  Pulse: 70  Height: 5\' 10"  (1.778 m)  Weight: 170 lb (77.1 kg)  SpO2: 98%  BMI (Calculated): 24.39    Physical Exam Vitals reviewed.  Constitutional:      Appearance: Normal appearance.  HENT:     Head: Normocephalic.     Left Ear: There is no impacted cerumen.      Nose: Nose normal.     Mouth/Throat:     Mouth: Mucous membranes are moist.     Pharynx: No posterior oropharyngeal erythema.  Eyes:     Extraocular Movements: Extraocular movements intact.     Pupils: Pupils are equal, round, and reactive to light.  Cardiovascular:     Rate and Rhythm: Regular rhythm.     Chest Wall: PMI is not displaced.     Pulses: Normal pulses.     Heart sounds: Normal heart sounds. No murmur heard. Pulmonary:     Effort: Pulmonary effort is normal.     Breath sounds: Normal air entry. No rhonchi or rales.  Abdominal:     General: Abdomen is flat. Bowel sounds are normal. There is no distension.     Palpations: Abdomen is soft. There is no hepatomegaly, splenomegaly or mass.     Tenderness: There is no abdominal tenderness.  Musculoskeletal:        General: Normal range of motion.     Cervical back: Normal range of motion and neck supple.     Right lower leg: No edema.     Left lower leg: No edema.  Skin:    General: Skin is warm and dry.  Neurological:     General: No focal deficit present.     Mental Status: He is alert and oriented to person, place, and time.     Cranial Nerves: No cranial nerve deficit.     Motor: No weakness.  Psychiatric:        Mood and Affect: Mood normal.        Behavior: Behavior normal.      No results found for any visits on 09/09/23.  Recent Results (from the past 2160 hours)  CBC With Diff/Platelet     Status: None   Collection Time: 08/19/23 11:15 AM  Result Value Ref Range   WBC 4.2 3.4 - 10.8 x10E3/uL   RBC 5.01 4.14 - 5.80 x10E6/uL   Hemoglobin 14.9 13.0 - 17.7 g/dL   Hematocrit 06.3 01.6 - 51.0 %   MCV 88 79 - 97 fL   MCH 29.7 26.6 - 33.0 pg   MCHC 33.9 31.5 - 35.7 g/dL   RDW 01.0 93.2 - 35.5 %   Platelets 265 150 - 450 x10E3/uL   Neutrophils 46 Not Estab. %   Lymphs 40 Not Estab. %   Monocytes 8 Not Estab. %   Eos 5 Not Estab. %   Basos 1 Not Estab. %  Neutrophils Absolute 1.9 1.4 - 7.0 x10E3/uL    Lymphocytes Absolute 1.7 0.7 - 3.1 x10E3/uL   Monocytes Absolute 0.4 0.1 - 0.9 x10E3/uL   EOS (ABSOLUTE) 0.2 0.0 - 0.4 x10E3/uL   Basophils Absolute 0.0 0.0 - 0.2 x10E3/uL   Immature Granulocytes 0 Not Estab. %   Immature Grans (Abs) 0.0 0.0 - 0.1 x10E3/uL  Catecholamines, Fractionated, Plasma     Status: None   Collection Time: 08/19/23 11:15 AM  Result Value Ref Range   Norepinephrine 433 0 - 874 pg/mL   Epinephrine <15 0 - 62 pg/mL   Dopamine <30 0 - 48 pg/mL      Assessment & Plan:  As per problem list. Problem List Items Addressed This Visit       Other   Palpitations   Relevant Orders   CBC With Diff/Platelet   Mixed hyperlipidemia - Primary   Relevant Orders   Lipid panel   Comprehensive metabolic panel    Return in about 6 months (around 03/09/2024) for cpe with labs prior.   Total time spent: 20 minutes  Luna Fuse, MD  09/09/2023   This document may have been prepared by [facility] Voice Recognition software and as such may include unintentional dictation errors.

## 2023-09-10 ENCOUNTER — Other Ambulatory Visit: Payer: Self-pay | Admitting: Internal Medicine

## 2023-09-10 DIAGNOSIS — R002 Palpitations: Secondary | ICD-10-CM

## 2023-09-20 ENCOUNTER — Ambulatory Visit: Payer: Medicaid Other | Admitting: Cardiovascular Disease

## 2023-09-20 LAB — CATECHOLAMINES, FRACTIONATED, URINE, 24 HOUR
Dopamine, Rand Ur: 94 ug/L
Epinephrine, Rand Ur: <3 ug/L
Norepinephrine, Rand Ur: <15 ug/L

## 2023-09-20 NOTE — Progress Notes (Signed)
 Informed via Mychart message

## 2023-09-23 ENCOUNTER — Ambulatory Visit (INDEPENDENT_AMBULATORY_CARE_PROVIDER_SITE_OTHER): Payer: Medicaid Other

## 2023-09-23 DIAGNOSIS — R0789 Other chest pain: Secondary | ICD-10-CM | POA: Diagnosis not present

## 2023-09-23 DIAGNOSIS — Z833 Family history of diabetes mellitus: Secondary | ICD-10-CM

## 2023-09-23 DIAGNOSIS — R002 Palpitations: Secondary | ICD-10-CM

## 2023-09-23 DIAGNOSIS — R0602 Shortness of breath: Secondary | ICD-10-CM

## 2023-09-23 DIAGNOSIS — I10 Essential (primary) hypertension: Secondary | ICD-10-CM

## 2023-09-24 ENCOUNTER — Telehealth: Payer: Self-pay | Admitting: Internal Medicine

## 2023-09-24 NOTE — Telephone Encounter (Signed)
 Patient called stating that when he checked his blood sugar this morning it was 144 & is asking if you can put a lab order in for his A1C to be checked. He has an appointment Thursday morning with [facility] and wants to get it checked then.

## 2023-09-25 MED ORDER — TECHNETIUM TC 99M SESTAMIBI GENERIC - CARDIOLITE
10.1800 | Freq: Once | INTRAVENOUS | Status: AC | PRN
  Administered 2023-09-23: 10.18 via INTRAVENOUS

## 2023-09-25 MED ORDER — TECHNETIUM TC 99M SESTAMIBI GENERIC - CARDIOLITE
31.8000 | Freq: Once | INTRAVENOUS | Status: AC | PRN
  Administered 2023-09-23: 31.8 via INTRAVENOUS

## 2023-09-26 ENCOUNTER — Encounter: Payer: Self-pay | Admitting: Cardiovascular Disease

## 2023-09-26 ENCOUNTER — Ambulatory Visit (INDEPENDENT_AMBULATORY_CARE_PROVIDER_SITE_OTHER): Payer: Medicaid Other | Admitting: Cardiovascular Disease

## 2023-09-26 VITALS — BP 118/84 | HR 75 | Ht 70.0 in | Wt 174.6 lb

## 2023-09-26 DIAGNOSIS — R0789 Other chest pain: Secondary | ICD-10-CM | POA: Diagnosis not present

## 2023-09-26 DIAGNOSIS — E782 Mixed hyperlipidemia: Secondary | ICD-10-CM

## 2023-09-26 DIAGNOSIS — R0602 Shortness of breath: Secondary | ICD-10-CM

## 2023-09-26 DIAGNOSIS — R002 Palpitations: Secondary | ICD-10-CM

## 2023-09-26 DIAGNOSIS — I10 Essential (primary) hypertension: Secondary | ICD-10-CM

## 2023-09-26 NOTE — Progress Notes (Signed)
Cardiology Office Note   Date:  09/26/2023   ID:  Kyle Graves, DOB [birth year], MRN [id number]  PCP:  Sherron Monday, MD  Cardiologist:  Adrian Blackwater, MD      History of Present Illness: Kyle Graves is a 28 y.o. male who presents for  Chief Complaint  Patient presents with   Follow-up    Nst and echo results    Chest pain wake him up while sleeping..  Talking to the patient he does not normally have chest pain but whenever he takes deep breath he feels tender in his chest.      Past Medical History:  Diagnosis Date   Hypertension      Past Surgical History:  Procedure Laterality Date   WISDOM TOOTH EXTRACTION       Current Outpatient Medications  Medication Sig Dispense Refill   amLODipine (NORVASC) 5 MG tablet TAKE 1 TABLET (5 MG TOTAL) BY MOUTH DAILY. 90 tablet 1   doxylamine, Sleep, (UNISOM) 25 MG tablet Take 25 mg by mouth at bedtime as needed for sleep.     hydrocortisone (ANUSOL-HC) 25 MG suppository Place 1 suppository (25 mg total) rectally 2 (two) times daily. (Patient not taking: Reported on 09/26/2023) 12 suppository 0   No current facility-administered medications for this visit.    Allergies:   Patient has no known allergies.    Social History:   reports that he has quit smoking. His smoking use included e-cigarettes. He has never used smokeless tobacco. He reports current alcohol use of about 28.0 standard drinks of alcohol per week. He reports that he does not currently use drugs after having used the following drugs: Cocaine.   Family History:  family history includes Diabetes in his father, paternal uncle, and paternal uncle; Heart disease in his maternal grandfather; Hypertension in his paternal uncle and paternal uncle.    ROS:     Review of Systems  Constitutional: Negative.   HENT: Negative.    Eyes: Negative.   Respiratory: Negative.    Gastrointestinal: Negative.   Genitourinary: Negative.    Musculoskeletal: Negative.   Skin: Negative.   Neurological: Negative.   Endo/Heme/Allergies: Negative.   Psychiatric/Behavioral: Negative.    All other systems reviewed and are negative.     All other systems are reviewed and negative.    PHYSICAL EXAM: VS:  BP 118/84   Pulse 75   Ht 5\' 10"  (1.778 m)   Wt 174 lb 9.6 oz (79.2 kg)   SpO2 95%   BMI 25.05 kg/m  , BMI Body mass index is 25.05 kg/m. Last weight:  Wt Readings from Last 3 Encounters:  09/26/23 174 lb 9.6 oz (79.2 kg)  09/09/23 170 lb (77.1 kg)  08/23/23 171 lb 9.6 oz (77.8 kg)     Physical Exam Vitals reviewed.  Constitutional:      Appearance: Normal appearance. He is normal weight.  HENT:     Head: Normocephalic.     Nose: Nose normal.     Mouth/Throat:     Mouth: Mucous membranes are moist.  Eyes:     Pupils: Pupils are equal, round, and reactive to light.  Cardiovascular:     Rate and Rhythm: Normal rate and regular rhythm.     Pulses: Normal pulses.     Heart sounds: Normal heart sounds.  Pulmonary:     Effort: Pulmonary effort is normal.  Abdominal:     General: Abdomen is flat. Bowel sounds are normal.  Musculoskeletal:        General: Normal range of motion.     Cervical back: Normal range of motion.  Skin:    General: Skin is warm.  Neurological:     General: No focal deficit present.     Mental Status: He is alert.  Psychiatric:        Mood and Affect: Mood normal.       EKG:   Recent Labs: 03/06/2023: ALT 23; BUN 7; Creatinine, Ser 0.94; Potassium 4.4; Sodium 141 08/19/2023: Hemoglobin 14.9; Platelets 265    Lipid Panel    Component Value Date/Time   CHOL 189 03/06/2023 0938   TRIG 121 03/06/2023 0938   HDL 63 03/06/2023 0938   CHOLHDL 3.0 03/06/2023 0938   LDLCALC 105 (H) 03/06/2023 0938      Other studies Reviewed: Additional studies/ records that were reviewed today include:  Review of the above records demonstrates:       No data to display             ASSESSMENT AND PLAN:    ICD-10-CM   1. Palpitations  R00.2 CARDIAC EVENT MONITOR   Patient states he was told that he had fast heartbeat as a child.  Even though chest pain is most likely costochondritis will do Holter monitor.    2. Mixed hyperlipidemia  E78.2 CARDIAC EVENT MONITOR    3. Primary hypertension  I10 CARDIAC EVENT MONITOR    4. Other chest pain  R07.89 CARDIAC EVENT MONITOR   stress test showed no Ischaemia, with normal LVEF.  Most likely the chest pain is due to musculoskeletal causes such as costochondritis.  Advised Tylenol .    5. SOB (shortness of breath)  R06.02 CARDIAC EVENT MONITOR       Problem List Items Addressed This Visit       Cardiovascular and Mediastinum   Primary hypertension   Relevant Orders   CARDIAC EVENT MONITOR     Other   Chest pain   Relevant Orders   CARDIAC EVENT MONITOR   Palpitations - Primary   Relevant Orders   CARDIAC EVENT MONITOR   Mixed hyperlipidemia   Relevant Orders   CARDIAC EVENT MONITOR   Other Visit Diagnoses       SOB (shortness of breath)       Relevant Orders   CARDIAC EVENT MONITOR          Disposition:   Return in about 4 weeks (around 10/24/2023) for holter and f/u.    Total time spent: 45 minutes  Signed,  Adrian Blackwater, MD  09/26/2023 10:30 AM    [facility]

## 2023-09-26 NOTE — Telephone Encounter (Signed)
Patient made aware when he came into his appointment with Dr. Welton Flakes

## 2023-10-03 ENCOUNTER — Ambulatory Visit: Payer: Medicaid Other | Admitting: Cardiovascular Disease

## 2023-10-03 DIAGNOSIS — R0789 Other chest pain: Secondary | ICD-10-CM

## 2023-10-03 DIAGNOSIS — R0602 Shortness of breath: Secondary | ICD-10-CM

## 2023-10-03 DIAGNOSIS — R000 Tachycardia, unspecified: Secondary | ICD-10-CM | POA: Diagnosis not present

## 2023-10-03 DIAGNOSIS — I10 Essential (primary) hypertension: Secondary | ICD-10-CM

## 2023-10-03 DIAGNOSIS — E782 Mixed hyperlipidemia: Secondary | ICD-10-CM

## 2023-10-03 DIAGNOSIS — R002 Palpitations: Secondary | ICD-10-CM

## 2023-10-24 ENCOUNTER — Ambulatory Visit: Payer: Medicaid Other | Admitting: Cardiovascular Disease

## 2023-10-24 ENCOUNTER — Encounter: Payer: Self-pay | Admitting: Cardiovascular Disease

## 2023-10-24 VITALS — BP 124/78 | HR 72 | Ht 70.0 in | Wt 174.0 lb

## 2023-10-24 DIAGNOSIS — E782 Mixed hyperlipidemia: Secondary | ICD-10-CM | POA: Diagnosis not present

## 2023-10-24 DIAGNOSIS — I4711 Inappropriate sinus tachycardia, so stated: Secondary | ICD-10-CM | POA: Diagnosis not present

## 2023-10-24 DIAGNOSIS — I10 Essential (primary) hypertension: Secondary | ICD-10-CM | POA: Diagnosis not present

## 2023-10-24 DIAGNOSIS — R002 Palpitations: Secondary | ICD-10-CM | POA: Diagnosis not present

## 2023-10-24 DIAGNOSIS — R0789 Other chest pain: Secondary | ICD-10-CM

## 2023-10-24 MED ORDER — METOPROLOL SUCCINATE ER 50 MG PO TB24: 50.0000 mg | ORAL_TABLET | Freq: Every day | ORAL | 11 refills | Status: DC

## 2023-10-24 NOTE — Progress Notes (Signed)
Cardiology Office Note   Date:  10/24/2023   ID:  Kyle Graves, DOB 07-24-1996, MRN [id number]  PCP:  Sherron Monday, MD  Cardiologist:  Adrian Blackwater, MD      History of Present Illness: Kyle Graves is a 28 y.o. male who presents for No chief complaint on file.   Had chest pain and palpitation associated with holter sowing sinus tachycardia up to 157/min at night.  Chest Pain  This is a chronic problem. The current episode started more than 1 year ago. The pain is present in the epigastric region. The pain is at a severity of 6/10. The quality of the pain is described as heavy. Associated symptoms include palpitations.      Past Medical History:  Diagnosis Date   Hypertension      Past Surgical History:  Procedure Laterality Date   WISDOM TOOTH EXTRACTION       Current Outpatient Medications  Medication Sig Dispense Refill   metoprolol succinate (TOPROL XL) 50 MG 24 hr tablet Take 1 tablet (50 mg total) by mouth daily. Take with or immediately following a meal. 30 tablet 11   amLODipine (NORVASC) 5 MG tablet TAKE 1 TABLET (5 MG TOTAL) BY MOUTH DAILY. 90 tablet 1   doxylamine, Sleep, (UNISOM) 25 MG tablet Take 25 mg by mouth at bedtime as needed for sleep.     hydrocortisone (ANUSOL-HC) 25 MG suppository Place 1 suppository (25 mg total) rectally 2 (two) times daily. (Patient not taking: Reported on 09/26/2023) 12 suppository 0   No current facility-administered medications for this visit.    Allergies:   Patient has no known allergies.    Social History:   reports that he has quit smoking. His smoking use included e-cigarettes. He has never used smokeless tobacco. He reports current alcohol use of about 28.0 standard drinks of alcohol per week. He reports that he does not currently use drugs after having used the following drugs: Cocaine.   Family History:  family history includes Diabetes in his father, paternal uncle, and paternal  uncle; Heart disease in his maternal grandfather; Hypertension in his paternal uncle and paternal uncle.    ROS:     Review of Systems  Constitutional: Negative.   HENT: Negative.    Eyes: Negative.   Respiratory: Negative.    Cardiovascular:  Positive for chest pain and palpitations.  Gastrointestinal: Negative.   Genitourinary: Negative.   Musculoskeletal: Negative.   Skin: Negative.   Neurological: Negative.   Endo/Heme/Allergies: Negative.   Psychiatric/Behavioral: Negative.    All other systems reviewed and are negative.     All other systems are reviewed and negative.    PHYSICAL EXAM: VS:  BP 124/78   Pulse 72   Ht 5\' 10"  (1.778 m)   Wt 174 lb (78.9 kg)   SpO2 98%   BMI 24.97 kg/m  , BMI Body mass index is 24.97 kg/m. Last weight:  Wt Readings from Last 3 Encounters:  10/24/23 174 lb (78.9 kg)  09/26/23 174 lb 9.6 oz (79.2 kg)  09/09/23 170 lb (77.1 kg)     Physical Exam Vitals reviewed.  Constitutional:      Appearance: Normal appearance. He is normal weight.  HENT:     Head: Normocephalic.     Nose: Nose normal.     Mouth/Throat:     Mouth: Mucous membranes are moist.  Eyes:     Pupils: Pupils are equal, round, and reactive to light.  Cardiovascular:  Rate and Rhythm: Normal rate and regular rhythm.     Pulses: Normal pulses.     Heart sounds: Normal heart sounds.  Pulmonary:     Effort: Pulmonary effort is normal.  Abdominal:     General: Abdomen is flat. Bowel sounds are normal.  Musculoskeletal:        General: Normal range of motion.     Cervical back: Normal range of motion.  Skin:    General: Skin is warm.  Neurological:     General: No focal deficit present.     Mental Status: He is alert.  Psychiatric:        Mood and Affect: Mood normal.       EKG:   Recent Labs: 03/06/2023: ALT 23; BUN 7; Creatinine, Ser 0.94; Potassium 4.4; Sodium 141 08/19/2023: Hemoglobin 14.9; Platelets 265    Lipid Panel    Component Value  Date/Time   CHOL 189 03/06/2023 0938   TRIG 121 03/06/2023 0938   HDL 63 03/06/2023 0938   CHOLHDL 3.0 03/06/2023 0938   LDLCALC 105 (H) 03/06/2023 0938      Other studies Reviewed: Additional studies/ records that were reviewed today include:  Review of the above records demonstrates:       No data to display            ASSESSMENT AND PLAN:    ICD-10-CM   1. Inappropriate sinus tachycardia (HCC)  I47.11 metoprolol succinate (TOPROL XL) 50 MG 24 hr tablet   had HR 157, advise metoprolol 50 daily at night    2. Palpitations  R00.2 metoprolol succinate (TOPROL XL) 50 MG 24 hr tablet    3. Mixed hyperlipidemia  E78.2 metoprolol succinate (TOPROL XL) 50 MG 24 hr tablet    4. Primary hypertension  I10 metoprolol succinate (TOPROL XL) 50 MG 24 hr tablet    5. Other chest pain  R07.89 metoprolol succinate (TOPROL XL) 50 MG 24 hr tablet       Problem List Items Addressed This Visit       Cardiovascular and Mediastinum   Primary hypertension   Relevant Medications   metoprolol succinate (TOPROL XL) 50 MG 24 hr tablet     Other   Chest pain   Relevant Medications   metoprolol succinate (TOPROL XL) 50 MG 24 hr tablet   Palpitations   Relevant Medications   metoprolol succinate (TOPROL XL) 50 MG 24 hr tablet   Mixed hyperlipidemia   Relevant Medications   metoprolol succinate (TOPROL XL) 50 MG 24 hr tablet   Other Visit Diagnoses       Inappropriate sinus tachycardia (HCC)    -  Primary   had HR 157, advise metoprolol 50 daily at night   Relevant Medications   metoprolol succinate (TOPROL XL) 50 MG 24 hr tablet          Disposition:   Return in about 2 months (around 12/22/2023).    Total time spent: 30 minutes  Signed,  Adrian Blackwater, MD  10/24/2023 9:26 AM    [facility]

## 2023-11-13 ENCOUNTER — Ambulatory Visit: Admitting: Internal Medicine

## 2023-11-13 ENCOUNTER — Encounter: Payer: Self-pay | Admitting: Internal Medicine

## 2023-11-13 VITALS — BP 110/72 | HR 74 | Temp 98.1°F | Ht 70.0 in | Wt 171.0 lb

## 2023-11-13 DIAGNOSIS — K1321 Leukoplakia of oral mucosa, including tongue: Secondary | ICD-10-CM

## 2023-11-13 NOTE — Progress Notes (Signed)
 Established Patient Office Visit  Subjective:  Patient ID: Kyle Graves, male    DOB: 1996-05-09  Age: 28 y.o. MRN: [id number]  Chief Complaint  Patient presents with   Follow-up    Spot on tongue X3 weeks    C/o "spot" on his tongue x 3 wks which is painless and denies preceding trauma.    No other concerns at this time.   Past Medical History:  Diagnosis Date   Hypertension     Past Surgical History:  Procedure Laterality Date   WISDOM TOOTH EXTRACTION      Social History   Socioeconomic History   Marital status: Single    Spouse name: Not on file   Number of children: Not on file   Years of education: Not on file   Highest education level: 12th grade  Occupational History   Occupation: Production designer, theatre/television/film at [employer], Full time  Tobacco Use   Smoking status: Former    Types: E-cigarettes   Smokeless tobacco: Never  Vaping Use   Vaping status: Former   Substances: Nicotine, Flavoring  Substance and Sexual Activity   Alcohol use: Yes    Alcohol/week: 28.0 standard drinks of alcohol    Types: 28 Cans of beer per week    Comment: everyday, beer and liquor average 8 beers a day   Drug use: Not Currently    Types: Cocaine   Sexual activity: Yes  Other Topics Concern   Not on file  Social History Narrative   Not on file   Social Drivers of Health   Financial Resource Strain: Low Risk  (06/12/2023)   Overall Financial Resource Strain (CARDIA)    Difficulty of Paying Living Expenses: Not hard at all  Food Insecurity: No Food Insecurity (06/12/2023)   Hunger Vital Sign    Worried About Running Out of Food in the Last Year: Never true    Ran Out of Food in the Last Year: Never true  Transportation Needs: No Transportation Needs (06/12/2023)   PRAPARE - Administrator, Civil Service (Medical): No    Lack of Transportation (Non-Medical): No  Physical Activity: Insufficiently Active (06/12/2023)   Exercise Vital Sign    Days of Exercise per Week: 1  day    Minutes of Exercise per Session: 20 min  Stress: No Stress Concern Present (06/12/2023)   [employer] of Occupational Health - Occupational Stress Questionnaire    Feeling of Stress : Not at all  Social Connections: Unknown (06/12/2023)   Social Connection and Isolation Panel [NHANES]    Frequency of Communication with Friends and Family: Not on file    Frequency of Social Gatherings with Friends and Family: Not on file    Attends Religious Services: Not on file    Active Member of Clubs or Organizations: No    Attends Banker Meetings: Not on file    Marital Status: Not on file  Intimate Partner Violence: Not on file    Family History  Problem Relation Age of Onset   Diabetes Father    Diabetes Paternal Uncle    Hypertension Paternal Uncle    Diabetes Paternal Uncle    Hypertension Paternal Uncle    Heart disease Maternal Grandfather     No Known Allergies  Outpatient Medications Prior to Visit  Medication Sig   amLODipine (NORVASC) 5 MG tablet TAKE 1 TABLET (5 MG TOTAL) BY MOUTH DAILY.   doxylamine, Sleep, (UNISOM) 25 MG tablet Take 25 mg by mouth  at bedtime as needed for sleep.   hydrocortisone (ANUSOL-HC) 25 MG suppository Place 1 suppository (25 mg total) rectally 2 (two) times daily. (Patient not taking: Reported on 09/26/2023)   metoprolol succinate (TOPROL XL) 50 MG 24 hr tablet Take 1 tablet (50 mg total) by mouth daily. Take with or immediately following a meal.   No facility-administered medications prior to visit.    Review of Systems  Constitutional: Negative.   HENT: Negative.    Eyes: Negative.   Respiratory: Negative.    Cardiovascular: Negative.   Gastrointestinal: Negative.   Genitourinary: Negative.   Skin: Negative.   Neurological: Negative.   Endo/Heme/Allergies: Negative.        Objective:   BP 110/72   Pulse 74   Temp 98.1 F (36.7 C)   Ht 5\' 10"  (1.778 m)   Wt 171 lb (77.6 kg)   SpO2 96%   BMI 24.54 kg/m    Vitals:   11/13/23 0847  BP: 110/72  Pulse: 74  Temp: 98.1 F (36.7 C)  Height: 5\' 10"  (1.778 m)  Weight: 171 lb (77.6 kg)  SpO2: 96%  BMI (Calculated): 24.54    Physical Exam Vitals reviewed.  Constitutional:      Appearance: Normal appearance.  HENT:     Head: Normocephalic.     Left Ear: There is no impacted cerumen.     Nose: Nose normal.     Mouth/Throat:     Mouth: Mucous membranes are moist.     Tongue: Lesions (leukoplakia on left glossal margin, one large and one small) present.     Pharynx: No posterior oropharyngeal erythema.  Eyes:     Extraocular Movements: Extraocular movements intact.     Pupils: Pupils are equal, round, and reactive to light.  Cardiovascular:     Rate and Rhythm: Regular rhythm.     Chest Wall: PMI is not displaced.     Pulses: Normal pulses.     Heart sounds: Normal heart sounds. No murmur heard. Pulmonary:     Effort: Pulmonary effort is normal.     Breath sounds: Normal air entry. No rhonchi or rales.  Abdominal:     General: Abdomen is flat. Bowel sounds are normal. There is no distension.     Palpations: Abdomen is soft. There is no hepatomegaly, splenomegaly or mass.     Tenderness: There is no abdominal tenderness.  Musculoskeletal:        General: Normal range of motion.     Cervical back: Normal range of motion and neck supple.     Right lower leg: No edema.     Left lower leg: No edema.  Skin:    General: Skin is warm and dry.  Neurological:     General: No focal deficit present.     Mental Status: He is alert and oriented to person, place, and time.     Cranial Nerves: No cranial nerve deficit.     Motor: No weakness.  Psychiatric:        Mood and Affect: Mood normal.        Behavior: Behavior normal.      No results found for any visits on 11/13/23.  Recent Results (from the past 2160 hours)  CBC With Diff/Platelet     Status: None   Collection Time: 08/19/23 11:15 AM  Result Value Ref Range   WBC 4.2 3.4  - 10.8 x10E3/uL   RBC 5.01 4.14 - 5.80 x10E6/uL   Hemoglobin 14.9 13.0 -  17.7 g/dL   Hematocrit 16.1 09.6 - 51.0 %   MCV 88 79 - 97 fL   MCH 29.7 26.6 - 33.0 pg   MCHC 33.9 31.5 - 35.7 g/dL   RDW 04.5 40.9 - 81.1 %   Platelets 265 150 - 450 x10E3/uL   Neutrophils 46 Not Estab. %   Lymphs 40 Not Estab. %   Monocytes 8 Not Estab. %   Eos 5 Not Estab. %   Basos 1 Not Estab. %   Neutrophils Absolute 1.9 1.4 - 7.0 x10E3/uL   Lymphocytes Absolute 1.7 0.7 - 3.1 x10E3/uL   Monocytes Absolute 0.4 0.1 - 0.9 x10E3/uL   EOS (ABSOLUTE) 0.2 0.0 - 0.4 x10E3/uL   Basophils Absolute 0.0 0.0 - 0.2 x10E3/uL   Immature Granulocytes 0 Not Estab. %   Immature Grans (Abs) 0.0 0.0 - 0.1 x10E3/uL  Catecholamines, Fractionated, Plasma     Status: None   Collection Time: 08/19/23 11:15 AM  Result Value Ref Range   Norepinephrine 433 0 - 874 pg/mL   Epinephrine <15 0 - 62 pg/mL   Dopamine <30 0 - 48 pg/mL  Catecholamines, fractionated, urine, 24 hour     Status: None   Collection Time: 09/10/23 10:38 AM  Result Value Ref Range   Epinephrine, Rand Ur <3 Undefined ug/L   Epinephrine, 24H Ur Comment 0 - 20 ug/24 hr    Comment: No total volume submitted.  Unable to calculate 24 hour result.   Norepinephrine, Rand Ur <15 Undefined ug/L   Norepinephrine, 24H Ur Comment 0 - 135 ug/24 hr    Comment: No total volume submitted.  Unable to calculate 24 hour result.   Dopamine, Rand Ur 94 Undefined ug/L   Dopamine , 24H Ur Comment 0 - 510 ug/24 hr    Comment: No total volume submitted.  Unable to calculate 24 hour result.      Assessment & Plan:  As per problem list. Refer for biopsy if tests are negative. Problem List Items Addressed This Visit   None Visit Diagnoses       Leukoplakia of tongue    -  Primary   Relevant Orders   HIV antibody (with reflex)   EPSTEIN-BARR VIRUS (EBV) Antibody Profile- [facility]/ [facility]       Return if symptoms worsen or fail to improve.   Total time spent: 30  minutes  Luna Fuse, MD  11/13/2023   This document may have been prepared by [facility] Voice Recognition software and as such may include unintentional dictation errors.

## 2023-11-14 LAB — EPSTEIN-BARR VIRUS (EBV) ANTIBODY PROFILE
EBV NA IgG: 230 U/mL — ABNORMAL HIGH (ref 0.0–17.9)
EBV VCA IgG: 32.5 U/mL — ABNORMAL HIGH (ref 0.0–17.9)
EBV VCA IgM: <36 U/mL (ref 0.0–35.9)

## 2023-11-14 LAB — HIV ANTIBODY (ROUTINE TESTING W REFLEX): HIV Screen 4th Generation wRfx: NONREACTIVE

## 2023-11-17 ENCOUNTER — Encounter: Payer: Self-pay | Admitting: Internal Medicine

## 2023-11-18 DIAGNOSIS — K1321 Leukoplakia of oral mucosa, including tongue: Secondary | ICD-10-CM

## 2023-11-18 NOTE — Progress Notes (Signed)
 Informed via Mychart message

## 2023-12-23 ENCOUNTER — Ambulatory Visit: Admitting: Cardiovascular Disease

## 2023-12-23 ENCOUNTER — Encounter: Payer: Self-pay | Admitting: Cardiovascular Disease

## 2023-12-23 VITALS — BP 114/70 | HR 70 | Ht 70.0 in | Wt 171.0 lb

## 2023-12-23 DIAGNOSIS — R002 Palpitations: Secondary | ICD-10-CM | POA: Diagnosis not present

## 2023-12-23 DIAGNOSIS — I4711 Inappropriate sinus tachycardia, so stated: Secondary | ICD-10-CM

## 2023-12-23 DIAGNOSIS — E782 Mixed hyperlipidemia: Secondary | ICD-10-CM | POA: Diagnosis not present

## 2023-12-23 DIAGNOSIS — I10 Essential (primary) hypertension: Secondary | ICD-10-CM | POA: Diagnosis not present

## 2023-12-23 NOTE — Progress Notes (Signed)
 Cardiology Office Note   Date:  12/23/2023   ID:  Kyle Graves, DOB June 04, 1996, MRN [id number]  PCP:  Sherron Monday, MD  Cardiologist:  Adrian Blackwater, MD      History of Present Illness: Kyle Graves is a 28 y.o. male who presents for  Chief Complaint  Patient presents with   Follow-up    2 month follow up     Patient has been feeling well with no chest pain or palpitation or shortness of breath.  Patient was unable to tolerate metoprolol well feeling like he is going to pass out thus stopped taking it.  Since the Holter monitor was given he has no episodes of palpitation and on Holter monitor which shows sinus tachycardia up to 150 bpm.  Right now he is feeling much better.  Patient states that since he stopped drinking he is feeling much better.  It is possible that he was having inappropriate tachycardia due to binge drinking.      Past Medical History:  Diagnosis Date   Hypertension      Past Surgical History:  Procedure Laterality Date   WISDOM TOOTH EXTRACTION       Current Outpatient Medications  Medication Sig Dispense Refill   amLODipine (NORVASC) 5 MG tablet TAKE 1 TABLET (5 MG TOTAL) BY MOUTH DAILY. 90 tablet 1   doxylamine, Sleep, (UNISOM) 25 MG tablet Take 25 mg by mouth at bedtime as needed for sleep.     hydrocortisone (ANUSOL-HC) 25 MG suppository Place 1 suppository (25 mg total) rectally 2 (two) times daily. (Patient not taking: Reported on 09/26/2023) 12 suppository 0   metoprolol succinate (TOPROL XL) 50 MG 24 hr tablet Take 1 tablet (50 mg total) by mouth daily. Take with or immediately following a meal. (Patient not taking: Reported on 12/23/2023) 30 tablet 11   No current facility-administered medications for this visit.    Allergies:   Patient has no known allergies.    Social History:   reports that he has quit smoking. His smoking use included e-cigarettes. He has never used smokeless tobacco. He reports current  alcohol use of about 28.0 standard drinks of alcohol per week. He reports that he does not currently use drugs after having used the following drugs: Cocaine.   Family History:  family history includes Diabetes in his father, paternal uncle, and paternal uncle; Heart disease in his maternal grandfather; Hypertension in his paternal uncle and paternal uncle.    ROS:     Review of Systems  Constitutional: Negative.   HENT: Negative.    Eyes: Negative.   Respiratory: Negative.    Gastrointestinal: Negative.   Genitourinary: Negative.   Musculoskeletal: Negative.   Skin: Negative.   Neurological: Negative.   Endo/Heme/Allergies: Negative.   Psychiatric/Behavioral: Negative.    All other systems reviewed and are negative.     All other systems are reviewed and negative.    PHYSICAL EXAM: VS:  BP 114/70   Pulse 70   Ht 5\' 10"  (1.778 m)   Wt 171 lb (77.6 kg)   SpO2 98%   BMI 24.54 kg/m  , BMI Body mass index is 24.54 kg/m. Last weight:  Wt Readings from Last 3 Encounters:  12/23/23 171 lb (77.6 kg)  11/13/23 171 lb (77.6 kg)  10/24/23 174 lb (78.9 kg)     Physical Exam Vitals reviewed.  Constitutional:      Appearance: Normal appearance. He is normal weight.  HENT:  Head: Normocephalic.     Nose: Nose normal.     Mouth/Throat:     Mouth: Mucous membranes are moist.  Eyes:     Pupils: Pupils are equal, round, and reactive to light.  Cardiovascular:     Rate and Rhythm: Normal rate and regular rhythm.     Pulses: Normal pulses.     Heart sounds: Normal heart sounds.  Pulmonary:     Effort: Pulmonary effort is normal.  Abdominal:     General: Abdomen is flat. Bowel sounds are normal.  Musculoskeletal:        General: Normal range of motion.     Cervical back: Normal range of motion.  Skin:    General: Skin is warm.  Neurological:     General: No focal deficit present.     Mental Status: He is alert.  Psychiatric:        Mood and Affect: Mood normal.        EKG:   Recent Labs: 03/06/2023: ALT 23; BUN 7; Creatinine, Ser 0.94; Potassium 4.4; Sodium 141 08/19/2023: Hemoglobin 14.9; Platelets 265    Lipid Panel    Component Value Date/Time   CHOL 189 03/06/2023 0938   TRIG 121 03/06/2023 0938   HDL 63 03/06/2023 0938   CHOLHDL 3.0 03/06/2023 0938   LDLCALC 105 (H) 03/06/2023 0938      Other studies Reviewed: Additional studies/ records that were reviewed today include:  Review of the above records demonstrates:       No data to display            ASSESSMENT AND PLAN:    ICD-10-CM   1. Inappropriate sinus tachycardia (HCC)  I47.11    Holter monitor just reveals sinus tachycardia intermittently up to 250 bpm.  No A-fib or SVT.  Without metoprolol he is feeling fine.  Will watch.    2. Mixed hyperlipidemia  E78.2     3. Primary hypertension  I10     4. Palpitations  R00.2        Problem List Items Addressed This Visit       Cardiovascular and Mediastinum   Primary hypertension     Other   Palpitations   Mixed hyperlipidemia   Other Visit Diagnoses       Inappropriate sinus tachycardia (HCC)    -  Primary   Holter monitor just reveals sinus tachycardia intermittently up to 250 bpm.  No A-fib or SVT.  Without metoprolol he is feeling fine.  Will watch.          Disposition:   Return in about 6 weeks (around 02/03/2024).    Total time spent: 45 minutes  Signed,  Debborah Fairly, MD  12/23/2023 9:52 AM    [facility]

## 2023-12-24 ENCOUNTER — Ambulatory Visit: Payer: Medicaid Other | Admitting: Cardiovascular Disease

## 2024-01-21 NOTE — Progress Notes (Signed)
 Holter Hook up on pt

## 2024-02-04 ENCOUNTER — Ambulatory Visit: Admitting: Cardiovascular Disease

## 2024-02-06 ENCOUNTER — Ambulatory Visit: Admitting: Cardiovascular Disease

## 2024-02-06 ENCOUNTER — Encounter: Payer: Self-pay | Admitting: Cardiovascular Disease

## 2024-02-06 VITALS — BP 110/64 | HR 72 | Ht 70.0 in | Wt 171.0 lb

## 2024-02-06 DIAGNOSIS — I4711 Inappropriate sinus tachycardia, so stated: Secondary | ICD-10-CM | POA: Diagnosis not present

## 2024-02-06 DIAGNOSIS — I10 Essential (primary) hypertension: Secondary | ICD-10-CM | POA: Diagnosis not present

## 2024-02-06 DIAGNOSIS — R002 Palpitations: Secondary | ICD-10-CM

## 2024-02-06 DIAGNOSIS — E782 Mixed hyperlipidemia: Secondary | ICD-10-CM

## 2024-02-06 MED ORDER — AMLODIPINE BESYLATE 5 MG PO TABS: 5.0000 mg | ORAL_TABLET | Freq: Every day | ORAL | 1 refills | Status: DC

## 2024-02-06 NOTE — Progress Notes (Signed)
 Cardiology Office Note   Date:  02/06/2024   ID:  Kyle Graves, DOB 15-Mar-1996, MRN [id number]  PCP:  Shari Daughters, MD  Cardiologist:  Debborah Fairly, MD      History of Present Illness: Kyle Graves is a 28 y.o. male who presents for  Chief Complaint  Patient presents with   Follow-up    6 week follow up    While sleeping HR was 46, but feels fine, no dizziness.      Past Medical History:  Diagnosis Date   Hypertension      Past Surgical History:  Procedure Laterality Date   WISDOM TOOTH EXTRACTION       Current Outpatient Medications  Medication Sig Dispense Refill   amLODipine  (NORVASC ) 5 MG tablet Take 1 tablet (5 mg total) by mouth daily. 90 tablet 1   doxylamine, Sleep, (UNISOM) 25 MG tablet Take 25 mg by mouth at bedtime as needed for sleep.     hydrocortisone  (ANUSOL -HC) 25 MG suppository Place 1 suppository (25 mg total) rectally 2 (two) times daily. (Patient not taking: Reported on 09/26/2023) 12 suppository 0   No current facility-administered medications for this visit.    Allergies:   Patient has no known allergies.    Social History:   reports that he has quit smoking. His smoking use included e-cigarettes. He has never used smokeless tobacco. He reports current alcohol use of about 28.0 standard drinks of alcohol per week. He reports that he does not currently use drugs after having used the following drugs: Cocaine.   Family History:  family history includes Diabetes in his father, paternal uncle, and paternal uncle; Heart disease in his maternal grandfather; Hypertension in his paternal uncle and paternal uncle.    ROS:     Review of Systems  Constitutional: Negative.   HENT: Negative.    Eyes: Negative.   Respiratory: Negative.    Gastrointestinal: Negative.   Genitourinary: Negative.   Musculoskeletal: Negative.   Skin: Negative.   Neurological: Negative.   Endo/Heme/Allergies: Negative.    Psychiatric/Behavioral: Negative.    All other systems reviewed and are negative.     All other systems are reviewed and negative.    PHYSICAL EXAM: VS:  BP 110/64   Pulse 72   Ht 5\' 10"  (1.778 m)   Wt 171 lb (77.6 kg)   SpO2 97%   BMI 24.54 kg/m  , BMI Body mass index is 24.54 kg/m. Last weight:  Wt Readings from Last 3 Encounters:  02/06/24 171 lb (77.6 kg)  12/23/23 171 lb (77.6 kg)  11/13/23 171 lb (77.6 kg)     Physical Exam Vitals reviewed.  Constitutional:      Appearance: Normal appearance. He is normal weight.  HENT:     Head: Normocephalic.     Nose: Nose normal.     Mouth/Throat:     Mouth: Mucous membranes are moist.  Eyes:     Pupils: Pupils are equal, round, and reactive to light.  Cardiovascular:     Rate and Rhythm: Normal rate and regular rhythm.     Pulses: Normal pulses.     Heart sounds: Normal heart sounds.  Pulmonary:     Effort: Pulmonary effort is normal.  Abdominal:     General: Abdomen is flat. Bowel sounds are normal.  Musculoskeletal:        General: Normal range of motion.     Cervical back: Normal range of motion.  Skin:  General: Skin is warm.  Neurological:     General: No focal deficit present.     Mental Status: He is alert.  Psychiatric:        Mood and Affect: Mood normal.       EKG:   Recent Labs: 03/06/2023: ALT 23; BUN 7; Creatinine, Ser 0.94; Potassium 4.4; Sodium 141 08/19/2023: Hemoglobin 14.9; Platelets 265    Lipid Panel    Component Value Date/Time   CHOL 189 03/06/2023 0938   TRIG 121 03/06/2023 0938   HDL 63 03/06/2023 0938   CHOLHDL 3.0 03/06/2023 0938   LDLCALC 105 (H) 03/06/2023 0938      Other studies Reviewed: Additional studies/ records that were reviewed today include:  Review of the above records demonstrates:       No data to display            ASSESSMENT AND PLAN:    ICD-10-CM   1. Palpitations  R00.2 amLODipine  (NORVASC ) 5 MG tablet    2. Primary hypertension  I10  amLODipine  (NORVASC ) 5 MG tablet   stable on amlodapine    3. Mixed hyperlipidemia  E78.2 amLODipine  (NORVASC ) 5 MG tablet    4. Inappropriate sinus tachycardia (HCC)  I47.11 amLODipine  (NORVASC ) 5 MG tablet   HR was 150 on monitor but sinus, and unable to tolerate metoprolol .       Problem List Items Addressed This Visit       Cardiovascular and Mediastinum   Primary hypertension   Relevant Medications   amLODipine  (NORVASC ) 5 MG tablet     Other   Palpitations - Primary   Relevant Medications   amLODipine  (NORVASC ) 5 MG tablet   Mixed hyperlipidemia   Relevant Medications   amLODipine  (NORVASC ) 5 MG tablet   Other Visit Diagnoses       Inappropriate sinus tachycardia (HCC)       HR was 150 on monitor but sinus, and unable to tolerate metoprolol .   Relevant Medications   amLODipine  (NORVASC ) 5 MG tablet          Disposition:   Return in about 3 months (around 05/08/2024).    Total time spent: 30 minutes  Signed,  Debborah Fairly, MD  02/06/2024 9:50 AM    [facility]

## 2024-05-08 ENCOUNTER — Ambulatory Visit: Admitting: Cardiovascular Disease

## 2024-05-08 ENCOUNTER — Encounter: Payer: Self-pay | Admitting: Cardiovascular Disease

## 2024-05-08 VITALS — BP 112/62 | HR 71 | Ht 70.0 in | Wt 169.2 lb

## 2024-05-08 DIAGNOSIS — I10 Essential (primary) hypertension: Secondary | ICD-10-CM | POA: Diagnosis not present

## 2024-05-08 DIAGNOSIS — R0789 Other chest pain: Secondary | ICD-10-CM

## 2024-05-08 DIAGNOSIS — R002 Palpitations: Secondary | ICD-10-CM | POA: Diagnosis not present

## 2024-05-08 DIAGNOSIS — I4711 Inappropriate sinus tachycardia, so stated: Secondary | ICD-10-CM

## 2024-05-08 DIAGNOSIS — E782 Mixed hyperlipidemia: Secondary | ICD-10-CM | POA: Diagnosis not present

## 2024-05-08 NOTE — Progress Notes (Signed)
 Cardiology Office Note   Date:  05/08/2024   ID:  Kyle Graves, DOB [birth year], MRN [id number]  PCP:  Albina GORMAN Dine, MD  Cardiologist:  Denyse Bathe, MD      History of Present Illness: Kyle Graves is a 28 y.o. male who presents for  Chief Complaint  Patient presents with   Follow-up    3 month follow up    Feels good      Past Medical History:  Diagnosis Date   Hypertension      Past Surgical History:  Procedure Laterality Date   WISDOM TOOTH EXTRACTION       Current Outpatient Medications  Medication Sig Dispense Refill   amLODipine  (NORVASC ) 5 MG tablet Take 1 tablet (5 mg total) by mouth daily. 90 tablet 1   doxylamine, Sleep, (UNISOM) 25 MG tablet Take 25 mg by mouth at bedtime as needed for sleep.     No current facility-administered medications for this visit.    Allergies:   Patient has no known allergies.    Social History:   reports that he has quit smoking. His smoking use included e-cigarettes. He has never used smokeless tobacco. He reports current alcohol use of about 28.0 standard drinks of alcohol per week. He reports that he does not currently use drugs after having used the following drugs: Cocaine.   Family History:  family history includes Diabetes in his father, paternal uncle, and paternal uncle; Heart disease in his maternal grandfather; Hypertension in his paternal uncle and paternal uncle.    ROS:     Review of Systems  Constitutional: Negative.   HENT: Negative.    Eyes: Negative.   Respiratory: Negative.    Gastrointestinal: Negative.   Genitourinary: Negative.   Musculoskeletal: Negative.   Skin: Negative.   Neurological: Negative.   Endo/Heme/Allergies: Negative.   Psychiatric/Behavioral: Negative.    All other systems reviewed and are negative.     All other systems are reviewed and negative.    PHYSICAL EXAM: VS:  BP 112/62   Pulse 71   Ht 5' 10 (1.778 m)   Wt 169 lb 3.2 oz  (76.7 kg)   SpO2 98%   BMI 24.28 kg/m  , BMI Body mass index is 24.28 kg/m. Last weight:  Wt Readings from Last 3 Encounters:  05/08/24 169 lb 3.2 oz (76.7 kg)  02/06/24 171 lb (77.6 kg)  12/23/23 171 lb (77.6 kg)     Physical Exam Vitals reviewed.  Constitutional:      Appearance: Normal appearance. He is normal weight.  HENT:     Head: Normocephalic.     Nose: Nose normal.     Mouth/Throat:     Mouth: Mucous membranes are moist.  Eyes:     Pupils: Pupils are equal, round, and reactive to light.  Cardiovascular:     Rate and Rhythm: Normal rate and regular rhythm.     Pulses: Normal pulses.     Heart sounds: Normal heart sounds.  Pulmonary:     Effort: Pulmonary effort is normal.  Abdominal:     General: Abdomen is flat. Bowel sounds are normal.  Musculoskeletal:        General: Normal range of motion.     Cervical back: Normal range of motion.  Skin:    General: Skin is warm.  Neurological:     General: No focal deficit present.     Mental Status: He is alert.  Psychiatric:  Mood and Affect: Mood normal.       EKG:   Recent Labs: 08/19/2023: Hemoglobin 14.9; Platelets 265    Lipid Panel    Component Value Date/Time   CHOL 189 03/06/2023 0938   TRIG 121 03/06/2023 0938   HDL 63 03/06/2023 0938   CHOLHDL 3.0 03/06/2023 0938   LDLCALC 105 (H) 03/06/2023 0938      Other studies Reviewed: Additional studies/ records that were reviewed today include:  Review of the above records demonstrates:       No data to display            ASSESSMENT AND PLAN:    ICD-10-CM   1. Palpitations  R00.2    No palpitations or chest pain.    2. Primary hypertension  I10     3. Mixed hyperlipidemia  E78.2     4. Inappropriate sinus tachycardia (HCC)  I47.11    Not taking metoprolol  and got sick, now off of it. But Hr has been fine. Quit drinking and smoking resolved it.    5. Other chest pain  R07.89        Problem List Items Addressed This  Visit       Cardiovascular and Mediastinum   Primary hypertension     Other   Chest pain   Palpitations - Primary   Mixed hyperlipidemia   Other Visit Diagnoses       Inappropriate sinus tachycardia (HCC)       Not taking metoprolol  and got sick, now off of it. But Hr has been fine. Quit drinking and smoking resolved it.          Disposition:   Return in about 3 months (around 08/08/2024).    Total time spent: 30 minutes  Signed,  Denyse Bathe, MD  05/08/2024 9:43 AM    [facility]

## 2024-05-12 ENCOUNTER — Encounter: Payer: Medicaid Other | Admitting: Internal Medicine

## 2024-06-16 ENCOUNTER — Encounter: Admitting: Internal Medicine

## 2024-07-08 ENCOUNTER — Ambulatory Visit (INDEPENDENT_AMBULATORY_CARE_PROVIDER_SITE_OTHER): Admitting: Internal Medicine

## 2024-07-08 ENCOUNTER — Encounter: Payer: Self-pay | Admitting: Internal Medicine

## 2024-07-08 ENCOUNTER — Other Ambulatory Visit: Payer: Self-pay | Admitting: Internal Medicine

## 2024-07-08 VITALS — BP 110/86 | HR 73 | Temp 97.6°F | Ht 70.0 in | Wt 168.0 lb

## 2024-07-08 DIAGNOSIS — Z0001 Encounter for general adult medical examination with abnormal findings: Secondary | ICD-10-CM

## 2024-07-08 DIAGNOSIS — R002 Palpitations: Secondary | ICD-10-CM

## 2024-07-08 DIAGNOSIS — E782 Mixed hyperlipidemia: Secondary | ICD-10-CM

## 2024-07-08 DIAGNOSIS — Z0000 Encounter for general adult medical examination without abnormal findings: Secondary | ICD-10-CM

## 2024-07-08 LAB — CBC WITH DIFF/PLATELET
Basophils Absolute: 0.1 x10E3/uL (ref 0.0–0.2)
Basos: 1 %
EOS (ABSOLUTE): 0.2 x10E3/uL (ref 0.0–0.4)
Eos: 3 %
Hematocrit: 45 % (ref 37.5–51.0)
Hemoglobin: 14.9 g/dL (ref 13.0–17.7)
Immature Grans (Abs): 0 x10E3/uL (ref 0.0–0.1)
Immature Granulocytes: 0 %
Lymphocytes Absolute: 2.4 x10E3/uL (ref 0.7–3.1)
Lymphs: 44 %
MCH: 29.4 pg (ref 26.6–33.0)
MCHC: 33.1 g/dL (ref 31.5–35.7)
MCV: 89 fL (ref 79–97)
Monocytes Absolute: 0.4 x10E3/uL (ref 0.1–0.9)
Monocytes: 7 %
Neutrophils Absolute: 2.4 x10E3/uL (ref 1.4–7.0)
Neutrophils: 45 %
Platelets: 217 x10E3/uL (ref 150–450)
RBC: 5.06 x10E6/uL (ref 4.14–5.80)
RDW: 13.7 % (ref 11.6–15.4)
WBC: 5.5 x10E3/uL (ref 3.4–10.8)

## 2024-07-08 NOTE — Progress Notes (Signed)
 Established Patient Office Visit  Subjective:  Patient ID: Kyle Graves, male    DOB: 16-Feb-1996  Age: 28 y.o. MRN: [id number]  Chief Complaint  Patient presents with   Annual Exam    CPE    Here for CPE, lab review and medication refills. No further palpitations since he stopped drinking and smoking.     No other concerns at this time.   Past Medical History:  Diagnosis Date   Hypertension     Past Surgical History:  Procedure Laterality Date   WISDOM TOOTH EXTRACTION      Social History   Socioeconomic History   Marital status: Single    Spouse name: Not on file   Number of children: Not on file   Years of education: Not on file   Highest education level: 12th grade  Occupational History   Occupation: Production Designer, Theatre/television/film at [employer], Full time  Tobacco Use   Smoking status: Former    Types: E-cigarettes   Smokeless tobacco: Never  Vaping Use   Vaping status: Former   Substances: Nicotine, Flavoring  Substance and Sexual Activity   Alcohol use: Not Currently    Alcohol/week: 28.0 standard drinks of alcohol    Types: 28 Cans of beer per week    Comment: everyday, beer and liquor average 8 beers a day   Drug use: Not Currently    Types: Cocaine   Sexual activity: Yes  Other Topics Concern   Not on file  Social History Narrative   Not on file   Social Drivers of Health   Financial Resource Strain: Low Risk  (06/12/2023)   Overall Financial Resource Strain (CARDIA)    Difficulty of Paying Living Expenses: Not hard at all  Food Insecurity: No Food Insecurity (01/21/2024)   Received from [facility]   Hunger Vital Sign    Within the past 12 months, you worried that your food would run out before you got the money to buy more.: Never true    Within the past 12 months, the food you bought just didn't last and you didn't have money to get more.: Never true  Transportation Needs: Unmet Transportation Needs (01/21/2024)   Received from [facility]    PRAPARE - Transportation    Lack of Transportation (Medical): Yes    Lack of Transportation (Non-Medical): Yes  Physical Activity: Insufficiently Active (06/12/2023)   Exercise Vital Sign    Days of Exercise per Week: 1 day    Minutes of Exercise per Session: 20 min  Stress: No Stress Concern Present (06/12/2023)   [employer] - Occupational Stress Questionnaire    Feeling of Stress : Not at all  Social Connections: Unknown (06/12/2023)   Social Connection and Isolation Panel    Frequency of Communication with Friends and Family: Not on file    Frequency of Social Gatherings with Friends and Family: Not on file    Attends Religious Services: Not on file    Active Member of Clubs or Organizations: No    Attends Banker Meetings: Not on file    Marital Status: Not on file  Intimate Partner Violence: Not At Risk (01/21/2024)   Received from [facility]   Humiliation, Afraid, Rape, and Kick questionnaire    Within the last year, have you been afraid of your partner or ex-partner?: No    Within the last year, have you been humiliated or emotionally abused in other ways by your partner or  ex-partner?: No    Within the last year, have you been kicked, hit, slapped, or otherwise physically hurt by your partner or ex-partner?: No    Within the last year, have you been raped or forced to have any kind of sexual activity by your partner or ex-partner?: No    Family History  Problem Relation Age of Onset   Diabetes Father    Diabetes Paternal Uncle    Hypertension Paternal Uncle    Diabetes Paternal Uncle    Hypertension Paternal Uncle    Heart disease Maternal Grandfather     No Known Allergies  Outpatient Medications Prior to Visit  Medication Sig   amLODipine  (NORVASC ) 5 MG tablet Take 1 tablet (5 mg total) by mouth daily.   Ashwagandha (ASHWAGANDHA GUMMIES) 500 MG CHEW Chew by mouth.   doxylamine, Sleep, (UNISOM) 25 MG tablet Take 25 mg by  mouth at bedtime as needed for sleep.   No facility-administered medications prior to visit.    Review of Systems  Constitutional:  Positive for weight loss.  HENT: Negative.    Eyes: Negative.   Respiratory: Negative.    Cardiovascular: Negative.  Negative for chest pain and palpitations.  Gastrointestinal:  Positive for heartburn (rarely and resolves spontaneously).  Genitourinary: Negative.   Skin: Negative.   Neurological: Negative.   Endo/Heme/Allergies: Negative.        Objective:   BP 110/86   Pulse 73   Temp 97.6 F (36.4 C) (Tympanic)   Ht 5' 10 (1.778 m)   Wt 168 lb (76.2 kg)   SpO2 97%   BMI 24.11 kg/m   Vitals:   07/08/24 0935  BP: 110/86  Pulse: 73  Temp: 97.6 F (36.4 C)  Height: 5' 10 (1.778 m)  Weight: 168 lb (76.2 kg)  SpO2: 97%  TempSrc: Tympanic  BMI (Calculated): 24.11    Physical Exam Vitals reviewed.  Constitutional:      Appearance: Normal appearance.  HENT:     Head: Normocephalic.     Left Ear: There is no impacted cerumen.     Nose: Nose normal.     Mouth/Throat:     Mouth: Mucous membranes are moist.     Tongue: No lesions.     Pharynx: No posterior oropharyngeal erythema.  Eyes:     Extraocular Movements: Extraocular movements intact.     Pupils: Pupils are equal, round, and reactive to light.  Cardiovascular:     Rate and Rhythm: Regular rhythm.     Chest Wall: PMI is not displaced.     Pulses: Normal pulses.     Heart sounds: Normal heart sounds. No murmur heard. Pulmonary:     Effort: Pulmonary effort is normal.     Breath sounds: Normal air entry. No rhonchi or rales.  Abdominal:     General: Abdomen is flat. Bowel sounds are normal. There is no distension.     Palpations: Abdomen is soft. There is no hepatomegaly, splenomegaly or mass.     Tenderness: There is no abdominal tenderness.  Musculoskeletal:        General: Normal range of motion.     Cervical back: Normal range of motion and neck supple.      Right lower leg: No edema.     Left lower leg: No edema.  Skin:    General: Skin is warm and dry.  Neurological:     General: No focal deficit present.     Mental Status: He is alert and oriented  to person, place, and time.     Cranial Nerves: No cranial nerve deficit.     Motor: No weakness.  Psychiatric:        Mood and Affect: Mood normal.        Behavior: Behavior normal.      No results found for any visits on 07/08/24.  No results found for this or any previous visit (from the past 2160 hours).    Assessment & Plan:  Izyk was seen today for annual exam.  Encounter for general adult medical examination with abnormal findings -     CBC With Diff/Platelet; Future -     Comprehensive metabolic panel with GFR; Future  Mixed hyperlipidemia -     Comprehensive metabolic panel with GFR -     Lipid panel -     Lipid panel; Future  Palpitations -     CBC With Diff/Platelet  Annual physical exam    Problem List Items Addressed This Visit       Other   Annual physical exam   Palpitations   Mixed hyperlipidemia   Relevant Orders   Lipid panel   Other Visit Diagnoses       Encounter for general adult medical examination with abnormal findings    -  Primary   Relevant Orders   CBC With Diff/Platelet   Comprehensive metabolic panel with GFR       Return in 1 year (on 07/08/2025) for cpe with labs prior.   Total time spent: 30 minutes. This time includes review of previous notes and results and patient face to face interaction during today'Jovonta Levit visit.    Sherrill Cinderella Perry, MD  07/08/2024   This document may have been prepared by [facility] Voice Recognition software and as such may include unintentional dictation errors.

## 2024-07-09 ENCOUNTER — Ambulatory Visit: Payer: Self-pay | Admitting: Internal Medicine

## 2024-07-09 LAB — LIPID PANEL
Chol/HDL Ratio: 3.5 ratio (ref 0.0–5.0)
Cholesterol, Total: 190 mg/dL (ref 100–199)
HDL: 55 mg/dL (ref >39)
LDL Chol Calc (NIH): 121 mg/dL — ABNORMAL HIGH (ref 0–99)
Triglycerides: 74 mg/dL (ref 0–149)
VLDL Cholesterol Cal: 14 mg/dL (ref 5–40)

## 2024-07-09 LAB — COMPREHENSIVE METABOLIC PANEL WITH GFR
ALT: 48 IU/L — ABNORMAL HIGH (ref 0–44)
AST: 26 IU/L (ref 0–40)
Albumin: 4.7 g/dL (ref 4.3–5.2)
Alkaline Phosphatase: 38 IU/L — ABNORMAL LOW (ref 47–123)
BUN/Creatinine Ratio: 14 (ref 9–20)
BUN: 14 mg/dL (ref 6–20)
Bilirubin Total: 0.4 mg/dL (ref 0.0–1.2)
CO2: 23 mmol/L (ref 20–29)
Calcium: 9.7 mg/dL (ref 8.7–10.2)
Chloride: 102 mmol/L (ref 96–106)
Creatinine, Ser: 1 mg/dL (ref 0.76–1.27)
Globulin, Total: 2.7 g/dL (ref 1.5–4.5)
Glucose: 93 mg/dL (ref 70–99)
Potassium: 4.8 mmol/L (ref 3.5–5.2)
Sodium: 140 mmol/L (ref 134–144)
Total Protein: 7.4 g/dL (ref 6.0–8.5)
eGFR: 105 mL/min/1.73 (ref >59)

## 2024-08-31 ENCOUNTER — Other Ambulatory Visit: Payer: Self-pay | Admitting: Cardiovascular Disease

## 2024-08-31 DIAGNOSIS — E782 Mixed hyperlipidemia: Secondary | ICD-10-CM

## 2024-08-31 DIAGNOSIS — I4711 Inappropriate sinus tachycardia, so stated: Secondary | ICD-10-CM

## 2024-08-31 DIAGNOSIS — I10 Essential (primary) hypertension: Secondary | ICD-10-CM

## 2024-08-31 DIAGNOSIS — R002 Palpitations: Secondary | ICD-10-CM

## 2024-09-07 ENCOUNTER — Ambulatory Visit: Admitting: Cardiovascular Disease

## 2025-07-09 ENCOUNTER — Encounter: Admitting: Internal Medicine
